# Patient Record
Sex: Female | Born: 1940 | Race: White | Hispanic: No | Marital: Married | Smoking: Never smoker
Health system: Southern US, Community
[De-identification: ages and names within clinical notes are randomized; demographics above are authoritative.]

## PROBLEM LIST (undated history)

## (undated) DIAGNOSIS — I739 Peripheral vascular disease, unspecified: Secondary | ICD-10-CM

## (undated) DIAGNOSIS — M1990 Unspecified osteoarthritis, unspecified site: Secondary | ICD-10-CM

## (undated) DIAGNOSIS — E7800 Pure hypercholesterolemia, unspecified: Secondary | ICD-10-CM

## (undated) DIAGNOSIS — M109 Gout, unspecified: Secondary | ICD-10-CM

## (undated) DIAGNOSIS — G629 Polyneuropathy, unspecified: Secondary | ICD-10-CM

## (undated) DIAGNOSIS — F32A Depression, unspecified: Secondary | ICD-10-CM

## (undated) DIAGNOSIS — T148XXA Other injury of unspecified body region, initial encounter: Secondary | ICD-10-CM

## (undated) DIAGNOSIS — I10 Essential (primary) hypertension: Secondary | ICD-10-CM

## (undated) DIAGNOSIS — D649 Anemia, unspecified: Secondary | ICD-10-CM

## (undated) DIAGNOSIS — E119 Type 2 diabetes mellitus without complications: Secondary | ICD-10-CM

## (undated) DIAGNOSIS — F329 Major depressive disorder, single episode, unspecified: Secondary | ICD-10-CM

## (undated) DIAGNOSIS — F419 Anxiety disorder, unspecified: Secondary | ICD-10-CM

## (undated) HISTORY — PX: COLONOSCOPY: SHX174

## (undated) HISTORY — PX: TOTAL SHOULDER ARTHROPLASTY: SHX126

## (undated) HISTORY — PX: REVERSE SHOULDER ARTHROPLASTY: SHX5054

## (undated) HISTORY — PX: UPPER GI ENDOSCOPY: SHX6162

## (undated) HISTORY — PX: AMPUTATION: SHX166

## (undated) HISTORY — PX: TOTAL KNEE ARTHROPLASTY: SHX125

## (undated) HISTORY — PX: CATARACT EXTRACTION, BILATERAL: SHX1313

## (undated) HISTORY — PX: FOOT SURGERY: SHX648

---

## 1997-11-20 ENCOUNTER — Inpatient Hospital Stay (HOSPITAL_COMMUNITY): Admission: RE | Admit: 1997-11-20 | Discharge: 1997-11-24 | Payer: Self-pay | Admitting: Orthopedic Surgery

## 1998-03-12 ENCOUNTER — Inpatient Hospital Stay (HOSPITAL_COMMUNITY): Admission: RE | Admit: 1998-03-12 | Discharge: 1998-03-16 | Payer: Self-pay | Admitting: Orthopedic Surgery

## 1998-06-14 ENCOUNTER — Other Ambulatory Visit: Admission: RE | Admit: 1998-06-14 | Discharge: 1998-06-14 | Payer: Self-pay | Admitting: Obstetrics and Gynecology

## 1998-07-13 ENCOUNTER — Other Ambulatory Visit: Admission: RE | Admit: 1998-07-13 | Discharge: 1998-07-13 | Payer: Self-pay | Admitting: Obstetrics and Gynecology

## 1999-02-19 ENCOUNTER — Encounter: Admission: RE | Admit: 1999-02-19 | Discharge: 1999-05-20 | Payer: Self-pay | Admitting: Orthopedic Surgery

## 1999-03-19 ENCOUNTER — Encounter: Payer: Self-pay | Admitting: Orthopedic Surgery

## 1999-04-30 ENCOUNTER — Encounter: Payer: Self-pay | Admitting: Orthopedic Surgery

## 1999-05-27 ENCOUNTER — Encounter: Admission: RE | Admit: 1999-05-27 | Discharge: 1999-08-25 | Payer: Self-pay | Admitting: Orthopedic Surgery

## 1999-05-28 ENCOUNTER — Encounter: Payer: Self-pay | Admitting: Orthopedic Surgery

## 1999-09-04 ENCOUNTER — Encounter: Payer: Self-pay | Admitting: Orthopedic Surgery

## 1999-09-04 ENCOUNTER — Encounter: Admission: RE | Admit: 1999-09-04 | Discharge: 1999-09-04 | Payer: Self-pay | Admitting: Orthopedic Surgery

## 1999-09-12 ENCOUNTER — Encounter: Payer: Self-pay | Admitting: Orthopedic Surgery

## 1999-09-12 ENCOUNTER — Encounter: Admission: RE | Admit: 1999-09-12 | Discharge: 1999-09-12 | Payer: Self-pay | Admitting: Orthopedic Surgery

## 1999-09-24 ENCOUNTER — Encounter: Admission: RE | Admit: 1999-09-24 | Discharge: 1999-12-23 | Payer: Self-pay | Admitting: Orthopedic Surgery

## 1999-10-03 ENCOUNTER — Encounter: Admission: RE | Admit: 1999-10-03 | Discharge: 1999-10-03 | Payer: Self-pay | Admitting: Family Medicine

## 1999-12-24 ENCOUNTER — Encounter: Admission: RE | Admit: 1999-12-24 | Discharge: 2000-03-09 | Payer: Self-pay | Admitting: Orthopedic Surgery

## 2000-03-25 ENCOUNTER — Encounter: Payer: Self-pay | Admitting: Orthopedic Surgery

## 2000-04-01 ENCOUNTER — Inpatient Hospital Stay (HOSPITAL_COMMUNITY): Admission: RE | Admit: 2000-04-01 | Discharge: 2000-04-04 | Payer: Self-pay | Admitting: Orthopedic Surgery

## 2000-04-28 ENCOUNTER — Encounter: Admission: RE | Admit: 2000-04-28 | Discharge: 2000-05-04 | Payer: Self-pay | Admitting: Internal Medicine

## 2000-08-19 ENCOUNTER — Encounter: Admission: RE | Admit: 2000-08-19 | Discharge: 2000-08-24 | Payer: Self-pay | Admitting: Internal Medicine

## 2000-10-20 ENCOUNTER — Encounter: Admission: RE | Admit: 2000-10-20 | Discharge: 2000-10-20 | Payer: Self-pay | Admitting: Family Medicine

## 2000-10-20 ENCOUNTER — Encounter: Payer: Self-pay | Admitting: Family Medicine

## 2000-11-11 ENCOUNTER — Encounter: Admission: RE | Admit: 2000-11-11 | Discharge: 2000-11-16 | Payer: Self-pay | Admitting: Internal Medicine

## 2001-02-26 ENCOUNTER — Encounter: Admission: RE | Admit: 2001-02-26 | Discharge: 2001-03-08 | Payer: Self-pay | Admitting: Orthopedic Surgery

## 2001-06-21 ENCOUNTER — Encounter (HOSPITAL_BASED_OUTPATIENT_CLINIC_OR_DEPARTMENT_OTHER): Admission: RE | Admit: 2001-06-21 | Discharge: 2001-06-25 | Payer: Self-pay | Admitting: Orthopedic Surgery

## 2001-09-24 ENCOUNTER — Encounter (HOSPITAL_BASED_OUTPATIENT_CLINIC_OR_DEPARTMENT_OTHER): Admission: RE | Admit: 2001-09-24 | Discharge: 2001-10-01 | Payer: Self-pay | Admitting: Orthopedic Surgery

## 2001-10-25 ENCOUNTER — Encounter: Payer: Self-pay | Admitting: Family Medicine

## 2001-10-25 ENCOUNTER — Encounter: Admission: RE | Admit: 2001-10-25 | Discharge: 2001-10-25 | Payer: Self-pay | Admitting: Family Medicine

## 2002-01-03 ENCOUNTER — Encounter (HOSPITAL_BASED_OUTPATIENT_CLINIC_OR_DEPARTMENT_OTHER): Admission: RE | Admit: 2002-01-03 | Discharge: 2002-04-03 | Payer: Self-pay | Admitting: Internal Medicine

## 2002-04-11 ENCOUNTER — Encounter (HOSPITAL_BASED_OUTPATIENT_CLINIC_OR_DEPARTMENT_OTHER): Admission: RE | Admit: 2002-04-11 | Discharge: 2002-07-10 | Payer: Self-pay | Admitting: Internal Medicine

## 2002-07-14 ENCOUNTER — Encounter (HOSPITAL_BASED_OUTPATIENT_CLINIC_OR_DEPARTMENT_OTHER): Admission: RE | Admit: 2002-07-14 | Discharge: 2002-10-12 | Payer: Self-pay | Admitting: Internal Medicine

## 2002-11-17 ENCOUNTER — Encounter: Admission: RE | Admit: 2002-11-17 | Discharge: 2002-11-17 | Payer: Self-pay

## 2003-11-20 ENCOUNTER — Encounter: Admission: RE | Admit: 2003-11-20 | Discharge: 2003-11-20 | Payer: Self-pay

## 2004-12-02 ENCOUNTER — Encounter: Admission: RE | Admit: 2004-12-02 | Discharge: 2004-12-02 | Payer: Self-pay

## 2005-12-04 ENCOUNTER — Encounter: Admission: RE | Admit: 2005-12-04 | Discharge: 2005-12-04 | Payer: Self-pay

## 2006-12-07 ENCOUNTER — Encounter: Admission: RE | Admit: 2006-12-07 | Discharge: 2006-12-07 | Payer: Self-pay

## 2007-12-08 ENCOUNTER — Encounter: Admission: RE | Admit: 2007-12-08 | Discharge: 2007-12-08 | Payer: Self-pay

## 2008-12-11 ENCOUNTER — Encounter: Admission: RE | Admit: 2008-12-11 | Discharge: 2008-12-11 | Payer: Self-pay

## 2009-12-12 ENCOUNTER — Encounter: Admission: RE | Admit: 2009-12-12 | Discharge: 2009-12-12 | Payer: Self-pay

## 2010-06-28 NOTE — H&P (Signed)
[facility]  Patient:    Caroline Day, Caroline Day                  MRN: [id number] Adm. Date:  04/01/00 Attending:  Carlisle Beers. Dorothyann Gibbs, M.D. Dictator:   Jamelle Rushing, P.A.-C.                         History and Physical  DATE OF BIRTH:  1940-06-26  CHIEF COMPLAINT:  Right knee pain.  HISTORY OF PRESENT ILLNESS:  The patient is a 70 year old obese white female with a history of right total knee arthroplasty performed in October 1999, and a left nee arthroplasty performed in January 2000.  The patient was placed in a short-leg ast for Charcots foot for about one year, and about the time she was to come out of  the cast around June 2001, the patient started to develop right knee pain once again.  The patient would have this severe pain in her knee with ambulation and  range of motion of the knee.  The patient would describe it as a deep ache, tooth ache in quality with weightbearing activities.  It has progressively worsened with time.  She occasionally feels like there is actually something physically loose  within her knee.  X-rays in the office revealed that she does have loosening of her tibial tray.  The patient has been ambulating with the use of a cane, and at times when it is most severe, she does use a wheelchair around the house.  ALLERGIES:  No known drug allergies.  CURRENT MEDICATIONS: 1. Lotrel 5/20 mg p.o. q.d. 2. Hydrochlorothiazide 25 mg p.o. q.d. 3. Lipitor 10 mg p.o. q.d. 4. Imipramine HCl 25 mg p.o. q.d. 5. Evista 60 mg p.o. q.d. 6. Glucophage 500 mg p.o. b.i.d.  PAST MEDICAL HISTORY: 1. The patient has been diagnosed with diabetes mellitus approximately    three months ago.  It was an incidental finding on a physical examination.    She has normally had her blood sugar since being on this medication in    the 140-170 range. 2. The patient has been on her hypertension medication for many years now.    It has been  fairly stable, with no medication adjustments.  The patient denies any specific thyroid disease, hiatal hernia, peptic ulcers, heart disease, or asthma.  PAST SURGICAL HISTORY: 1. A right total knee arthroplasty in October 1999. 2. Left total knee arthroplasty in January 2000. 3. Left shoulder open rotator cuff debridement in October 2001.  The patient denies any complications with the above-mentioned surgical procedures.  SOCIAL HISTORY:  The patient is a 70 year old obese white female, who denies any history of smoking or alcohol use.  The patient is married, with one child. Lives in a one-story house with two steps to the main entrance.  FAMILY PHYSICIAN:  Listed as Dr. Konrad Felix at [phone].  FAMILY HISTORY:  Mother deceased at age 53 from cardiac disease.  Father deceased at age 25 from Alzheimers.  The patient has two brothers alive and in good health.  One sister alive with a cardiac pacemaker and thyroid disease.  REVIEW OF SYSTEMS:  Positive for lower dentures.  The patient does use glasses t all times.  She has been having problems with urinary incontinence and increased frequency, and for this she has recently been placed on the imipramine, which has significantly improved her incontinence.  Otherwise the review of systems  is negative and noncontributory for any other GENERAL, SENSORY, RESPIRATORY, CARDIAC, GI, GU, HEMATOLOGIC, MUSCULOSKELETAL, NEUROLOGIC, MENTAL STATUS problems that are contributory at this time.  PHYSICAL EXAMINATION:  VITAL SIGNS:  Height 5 feet 9 inches, weight 300 pounds, pulse 92, respirations 12, temperature 97.0 degrees, blood pressure 148/92.  GENERAL:  This is a well-developed, significantly obese white female.  She does  walk with a cane in her right hand, and she does walk with a significant limp. She is able to get on and off the examination table without any assistance.  No  significant obvious distress.  HEENT:  Head  normocephalic, atraumatic.  Nontender over the maxillary or frontal sinuses.  Pupils equal, round, reactive to light and accommodation. Extraocular movements intact.  Sclerae anicteric.  Conjunctivae pink and moist.  External ears without deformities.  Canals patent.  Tympanic membranes pearly gray and intact. Gross hearing is intact.  Oral buccal mucosa was pink and moist, without lesions. Lower dentures were in place.  Upper dentition in fair repair.  Uvula midline, moved symmetrically with phonation.  The patient was able to swallow without any difficulty.  Nasal septum was midline.  Mucous membranes pink and moist.  No polyps noted.  NECK:  Supple, no palpable lymphadenopathy.  Thyroid gland nontender.  the patient had good range of motion of her cervical spine without any difficulty or tenderness.  CHEST:  Lung sounds clear and equal bilaterally.  No wheezes, rales, or rhonchi, or rubs noted.  HEART:  A regular rate and rhythm with S1 and S2 auscultated.  No murmurs, rubs, or gallops noted.  ABDOMEN:  Excessively round and obese.  Unable to palpate any hepatosplenomegaly due to her obesity.  She was nontender to deep palpation.  Bowel sounds present and normoactive throughout.   CVA was nontender to percussion.  EXTREMITIES:  Upper extremities:  Left shoulder:  The patient was able to only abduct to about 90 degrees both laterally, and extend anteriorly to 90 degrees.  Right shoulder:  She had an excellent range of motion without any difficulty or  tenderness.  Right and left elbows and wrists had excellent range of motion without any difficulty or tenderness.  She had 5/5 motor strength in all muscle groups.   Lower extremities:  Right and left hip had an excellent range of motion with 20-30 degrees of internal and external rotation, and 0-120 degrees of flexion.  Right and left knees were symmetric in size and shape.  She had well-healed  midline surgical incisions over both right and left knees.  Right and left knee medial and lateral joint line were nontender to palpation.  She had no palpable effusion in either   knee.  She had full extension and flexion to about 90 degrees in the right and eft knee.  The right knee did have about 10 degrees of valgus and varus laxity, but no tenderness.  Left knee had very trace valgus and varus laxity.  She had no calf  tenderness.  Bilateral ankles were symmetrical with a good range of motion.  PERIPHERAL VASCULAR:  Carotid pulses 1+, no bruits.  Radial pulses were 2+. Unable to palpate any femoral pulses.  Dorsalis pedis pulses were 1+.  The patient had  trace amount of lower extremity edema.  She had no venous stasis changes, and no obvious varicosities.  NEUROLOGIC:  The patient was conscious, alert, and appropriate, and held an easy conversation with the examiner.  Cranial nerves II-XII were grossly intact. Deep tendon reflexes  of the upper and lower extremities were symmetrical.  The patient was grossly intact to light touch sensation from head down to just about the ankles.  She had no light touch sensation about either right or left foot due to her diabetic neuropathy.  BREASTS/RECTAL/GU:  Examinations were deferred.  IMPRESSION: 1. Failed right total knee arthroplasty. 2. Diabetes mellitus type 2. 3. Hypertension. 4. Urinary incontinence. 5. Peripheral diabetic neuropathy.  PLAN:  The patient will be admitted to [facility] on April 01, 2000, under the care of Dr. Jonny Ruiz L. Rendall III.  The patient will undergo ll routine labs and tests for a planned revision of her right total knee arthroplasty. DD:  03/25/00 TD:  03/25/00 Job: [id number]

## 2010-06-28 NOTE — Op Note (Signed)
[facility]  Patient:    Caroline Day, Caroline Day                  MRN: [id number] Proc. Date: 04/01/00 Adm. Date:  [id number] Attending:  Carolan Shiver Ii                           Operative Report  PREOPERATIVE DIAGNOSIS:  Loose right total knee.  SURGICAL PROCEDURE:  Revision of tibial component, right total knee.  POSTOPERATIVE DIAGNOSIS:  Failure of tibial component, right total knee.  SURGEON:  John L. Dorothyann Gibbs, M.D.  ASSISTANT:  Lucienne Minks Duffy, P.A.  PATHOLOGY:  The patient has documented peripheral neuropathy, with a Charcots foot.  A total knee was performed a little over two years ago, and she developed Charcot-like collapse of the total knee and to valgus about six to eight months ago.  She has had full neurologic workup.  She understands that this total knee could also fail.  DESCRIPTION OF PROCEDURE:  Under general anesthesia, the right leg is prepared with Duraprep and draped as a sterile field.  A proximal thigh sterile tourniquet is used.  The leg is wrapped out with the Esmarch and the tourniquet is used to 350 mmHg.  The previous skin incision is excised.  The retinaculum and capsule are opened, and the line of the old Tycron sutures. The patella is everted.  The synovium is boggy and thick, but the joint fluid does not appear infected.  CNS is sent.  Following this, the tibial spacer polyethylene is removed.  The tibial tray is grossly loose and is levered out of the joint and removed.  Dissection around the medial tibia plateau to the posterior surface is done for exposure.  A McHale is placed posteriorly and a Homan laterally.  The proximal tibial cement plug is removed and the canal is cleaned out with curet and Leksell rongeurs.  Following this, an intermedullary guide is used and proximal tibia is recut horizontally until such point as Gore-Tex could be obtained anteriorly, posteriorly on most of both sides.  There was a  slight defect in coverage medially.  Once this was completed the canal was reamed by hand reaming up to 16 mm, and a trial stem with a 16 mm was inserted and 15 mm bearing.  The fit and alignment appeared excellent.  At this point, prior to doing anything else, the femoral component was carefully examined.  It was solidly ingrown to the femur.  The patellar component was solidly ingrown to the patella.  The patella plastic was applied.  At this point the permanent tibial revision component was placed, with a long fluted stem, and 95 mm fluted stem was inserted, and the custom LCS tibial tray (15 mm thick, standard-plus size).  These were cemented in place; and in the process of driving them into the knee, the large mallet head broke off the handle.  This occurred 5 mm higher than where I would like to have seated the tibial component; but it took 5 min to get another hammer into the room, and the cement had totally hardened at that point.  An attempt to drive the prosthesis deeper with the small hammer was completely unsuccessful. However, the overall position and alignment, and the 5 mm cement mantle were felt to be acceptable; and better to leave it like this than tear everything out and reapply.  So, at this point the tourniquet was let  down.  Multiple small vessels were cauterized.  The knee was closed in layers with #1 Tycron, 0 and 2-0 Vicryl, and skin clips.  OPERATIVE TIME:  Approximately 1 hour 20 min.  DISPOSITION:  The patient tolerated the procedure well and returned to recovery in good condition.  Excellent fit and alignment of the prosthesis in the knee were felt to have been obtained, as the knee was quite stable through a normal range of motion. DD:  04/01/00 TD:  04/02/00 Job: [id number]

## 2010-06-28 NOTE — Discharge Summary (Signed)
[facility]  Patient:    Caroline Day, Caroline Day                  MRN: [id number] Adm. Date:  [id number] Disc. Date: [id number] Attending:  Sharren Bridge Dictator:   Jamelle Rushing, P.A.                           Discharge Summary  ADMISSION DIAGNOSES: 1. Failed right total knee arthroplasty. 2. Diabetes mellitus, type 2. 3. Hypertension. 4. Urinary incontinence. 5. Peripheral diabetic neuropathy.  DISCHARGE DIAGNOSES: 1. Revision of right total knee arthroplasty. 2. Diabetes mellitus, type 2. 3. Hypertension. 4. Urinary incontinence. 5. Peripheral diabetic neuropathy. 6. Obesity.  HISTORY OF PRESENT ILLNESS:  The patient is a 70 year old, obese, white female with a history of right total knee arthroplasty performed in October of 1999. The patient had a left total knee arthroplasty performed in January of 2000. The patient was placed in a short leg cast for a Charcot foot for about one year.  At about that time, she was starting to come out of the cast.  Around June of 2001, the patient started developing right knee pain.  The patient would have this severe pain in her knee with ambulation and any range of motion.  The patient described the pain as a deep ache of toothache quality with weightbearing activities.  It progressively worsened with time.  She occasionally feels like there is actually something physically loose within the knee.  X-rays in the office revealed that she does have a loosening of her tibial tray.  ALLERGIES:  No known drug allergies.  CURRENT MEDICATIONS: 1. Lotrel 5/220 mg p.o. q.d. 2. Hydrochlorothiazide 25 mg p.o. q.d. 3. Lipitor 10 mg p.o. q.d. 4. Imipramine HCl 25 mg p.o. q.d. 5. Evista 60 mg p.o. q.d. 6. Glucophage 70 mg p.o. b.i.d.  SURGICAL PROCEDURE:  On April 01, 2000, the patient was taken to the OR by Jonny Ruiz L. Dorothyann Gibbs, M.D., assisted by Arnoldo Morale, P.A.  A revision of her right total knee with  tibial component was performed.  The patient tolerated the procedure well.  The operative time was approximately 1 hour and 20 minutes.  The patient was transferred to the recovery room in good condition.  CONSULTS:  On April 01, 2000, the patient had the following routine consults:  Physical therapy, occupational therapy, rehabilitation, care management, and pharmacy for routine dosing of Coumadin for DVT prophylaxis.  HOSPITAL COURSE:  On April 01, 2000, the patient was admitted to [facility] under the care of John L. Dorothyann Gibbs, M.D.  The patient underwent a revision of her right total knee arthroplasty.  The patient tolerated the procedure well and was transferred to the recovery room in good condition.  The patient then incurred a three-day postoperative course in which she did have some problems with muscle spasms her first night and some slight nausea the second night, but then progressed very well with physical therapy.  Her labs remained stable.  Her vital signs remained stable and her right knee remained negative for any Homans sign.  It remained neuromotor and vascularly intact.  The patient was transferred from the PCA to oral pain medications on postoperative day #2 without any complaints.  The patient was discharged to home on postoperative day #3 with home health physical therapy and R.N. arrangements made.  LABORATORY DATA:  On April 04, 2000, the WBC was 8.1, hemoglobin 12.3,  hematocrit 36.2, and platelets 209.  Coagulation studies on April 04, 2000, were PT 20.4 with an INR of 2.3.  Routine chemistries on April 03, 2000, were sodium of 134, potassium 3.2, glucose 231, BUN 7, and creatinine 0.7. The patients glucose had remained elevated and she was having her medications and diet adjusted per in-house diabetic management committee.  Routine urinalysis on admission was cloudy and had large hemoglobin, but was otherwise normal.  The EKG on  admission was normal sinus rhythm with occasional premature supraventricular complexes at 87 beats per minute.  The chest x-ray was no active disease on admission.  MEDICATIONS ON DISCHARGE:  1. Tylenol 650 mg p.o. q.6h. p.r.n.  2. Norvasc 5 mg p.o. q.d.  3. Lotensin 20 mg p.o. q.d.  4. Dulcolax 15 mg p.o. q.d.  5. Colace 100 mg p.o. b.i.d.  6. Hydrochlorothiazide 25 mg p.o. q.d.  7. Imipramine HCl 25 mg p.o. q.d.  8. Skelaxin 400 mg two tablets p.o. q.6h. p.r.n. spasms.  9. Glucophage 500 mg p.o. b.i.d. 10. Oxycodone 20 mg p.o. q.12h. 11. Percocet one or two tablets every four to six hours p.r.n. breakthrough     pain. 12. Zocor 20 mg p.o. q.d. 13. Coumadin 7.5 mg p.o. q.d.  DISCHARGE INSTRUCTIONS: 1. Medications:  Coumadin per pharmacy dosing, Percocet one or two tablets    every four to six hours p.r.n. pain. 2. Activity:  Knee precautions. 3. Diet:  An ADA diet. 4. Wound care:  Keep the dressing clean and dry. 5. Special instructions:  [facility] to provide prothrombin time and    home health physical therapy.  CONDITION ON DISCHARGE:  The patients condition on discharge to home is listed as good. DD:  05/11/00 TD:  05/11/00 Job: [id number]

## 2010-12-04 ENCOUNTER — Other Ambulatory Visit: Payer: Self-pay | Admitting: Family Medicine

## 2010-12-04 DIAGNOSIS — Z1231 Encounter for screening mammogram for malignant neoplasm of breast: Secondary | ICD-10-CM

## 2010-12-18 ENCOUNTER — Ambulatory Visit
Admission: RE | Admit: 2010-12-18 | Discharge: 2010-12-18 | Disposition: A | Discharge status: Home / Self Care | Payer: Medicare Other | Source: Ambulatory Visit | Attending: Family Medicine | Admitting: Family Medicine

## 2010-12-18 DIAGNOSIS — Z1231 Encounter for screening mammogram for malignant neoplasm of breast: Secondary | ICD-10-CM

## 2011-11-21 ENCOUNTER — Other Ambulatory Visit: Payer: Self-pay | Admitting: Family Medicine

## 2011-11-21 DIAGNOSIS — Z1231 Encounter for screening mammogram for malignant neoplasm of breast: Secondary | ICD-10-CM

## 2011-12-24 ENCOUNTER — Ambulatory Visit
Admission: RE | Admit: 2011-12-24 | Discharge: 2011-12-24 | Disposition: A | Discharge status: Home / Self Care | Payer: BC Managed Care – PPO | Source: Ambulatory Visit | Attending: Family Medicine | Admitting: Family Medicine

## 2011-12-24 DIAGNOSIS — Z1231 Encounter for screening mammogram for malignant neoplasm of breast: Secondary | ICD-10-CM

## 2012-11-18 ENCOUNTER — Other Ambulatory Visit: Payer: Self-pay

## 2012-11-18 DIAGNOSIS — Z1231 Encounter for screening mammogram for malignant neoplasm of breast: Secondary | ICD-10-CM

## 2012-12-31 ENCOUNTER — Ambulatory Visit
Admission: RE | Admit: 2012-12-31 | Discharge: 2012-12-31 | Disposition: A | Discharge status: Home / Self Care | Payer: Medicare Other | Source: Ambulatory Visit

## 2012-12-31 DIAGNOSIS — Z1231 Encounter for screening mammogram for malignant neoplasm of breast: Secondary | ICD-10-CM

## 2016-03-10 NOTE — Patient Instructions (Signed)
Your procedure is scheduled on:  Tomorrow, Jan. 31, 2018  Enter through the [employer] of [facility] at:  11:30 AM  Pick up the phone at the desk and dial [phone].  Call this number if you have problems the morning of surgery: [phone].  Remember: Do NOT eat food:  After Midnight Tonight  Do NOT drink clear liquids after:  9:00 AM day of surgery  Take these medicines the morning of surgery with a SIP OF WATER:  Allopurinol, Atorvastatin, Furosemide, Gabapentin, Lisinopril, Metoprolol  Stop ALL herbal medications at this time  Do NOT smoke the day of surgery.  Do NOT wear jewelry (body piercing), metal hair clips/bobby pins, make-up, or nail polish. Do NOT wear lotions, powders, or perfumes.  You may wear deodorant. Do NOT shave for 48 hours prior to surgery. Do NOT bring valuables to the hospital. Contacts, dentures, or bridgework may not be worn into surgery.  Have a responsible adult drive you home and stay with you for 24 hours after your procedure  Bring a copy of your healthcare power of attorney and living will documents.  **Effective Friday, Jan. 12, 2018, [place] will implement no hospital visitations from children age 84 and younger due to a steady increase in flu activity in our community and hospitals. **

## 2016-03-10 NOTE — H&P (Signed)
Caroline Day is an 76 y.o. female with postmenopausal bleeding and thickened endometrium and possible intrauterine polyp or fibroid.  Recent endometrial biopsy negative.  Pertinent Gynecological History: Menses: post-menopausal Bleeding: post menopausal bleeding Contraception: none DES exposure: denies Blood transfusions: none Sexually transmitted diseases: no past history Previous GYN Procedures: na  Last mammogram: normal Date: 2017   Last pap: unknown Date: na  OB History: G0, P0   Menstrual History: Menarche age: uunknown No LMP recorded. Patient is postmenopausal.  past medical history indicates diabetes, hypertension and arthritis Past surgery: shoulder replacement, knee replacements, cholecystectomy, foot surgery toe amputation No family history on file.  Social History:  has no tobacco, alcohol, and drug history on file.  Allergies:  Allergies  Allergen Reactions  . Vancomycin Itching    No prescriptions prior to admission.    ROS  There were no vitals taken for this visit. Physical Exam  Constitutional: She is oriented to person, place, and time. She appears well-developed and well-nourished.  HENT:  Head: Normocephalic.  Eyes: Conjunctivae and EOM are normal. Pupils are equal, round, and reactive to light.  Cardiovascular: Normal rate, regular rhythm and normal heart sounds.   Respiratory: Effort normal and breath sounds normal.  GI: Soft. Bowel sounds are normal.  Genitourinary: Vagina normal and uterus normal.  Musculoskeletal: Normal range of motion.  Neurological: She is alert and oriented to person, place, and time.    No results found for this or any previous visit (from the past 24 hour(s)).  No results found.  Assessment/Plan:Hysteroscopy with myosure and uterine curetting.  Risk discussed Infection. hemorrhage with possible transfusion with risk of AIDS or hepatitis.  Excessive bleeding could require hysterectomy. Risk of perforation with  injuring to adjacent organs requiring exp surgery. Risk of DVT and PE>  Maor Meckel S 03/10/2016, 8:21 AM

## 2016-03-11 ENCOUNTER — Encounter (HOSPITAL_COMMUNITY): Payer: Self-pay

## 2016-03-11 ENCOUNTER — Encounter (HOSPITAL_COMMUNITY)
Admission: RE | Admit: 2016-03-11 | Discharge: 2016-03-11 | Disposition: A | Discharge status: Home / Self Care | Payer: Medicare Other | Source: Ambulatory Visit | Attending: Obstetrics and Gynecology | Admitting: Obstetrics and Gynecology

## 2016-03-11 DIAGNOSIS — M1990 Unspecified osteoarthritis, unspecified site: Secondary | ICD-10-CM | POA: Diagnosis not present

## 2016-03-11 DIAGNOSIS — D259 Leiomyoma of uterus, unspecified: Secondary | ICD-10-CM | POA: Diagnosis not present

## 2016-03-11 DIAGNOSIS — Z01818 Encounter for other preprocedural examination: Secondary | ICD-10-CM | POA: Insufficient documentation

## 2016-03-11 DIAGNOSIS — N840 Polyp of corpus uteri: Secondary | ICD-10-CM | POA: Diagnosis not present

## 2016-03-11 DIAGNOSIS — Z881 Allergy status to other antibiotic agents status: Secondary | ICD-10-CM | POA: Diagnosis not present

## 2016-03-11 DIAGNOSIS — I10 Essential (primary) hypertension: Secondary | ICD-10-CM | POA: Diagnosis not present

## 2016-03-11 DIAGNOSIS — E119 Type 2 diabetes mellitus without complications: Secondary | ICD-10-CM | POA: Diagnosis not present

## 2016-03-11 DIAGNOSIS — F329 Major depressive disorder, single episode, unspecified: Secondary | ICD-10-CM | POA: Diagnosis not present

## 2016-03-11 DIAGNOSIS — F419 Anxiety disorder, unspecified: Secondary | ICD-10-CM | POA: Diagnosis not present

## 2016-03-11 DIAGNOSIS — Z9049 Acquired absence of other specified parts of digestive tract: Secondary | ICD-10-CM | POA: Diagnosis not present

## 2016-03-11 DIAGNOSIS — N950 Postmenopausal bleeding: Secondary | ICD-10-CM | POA: Diagnosis present

## 2016-03-11 HISTORY — DX: Major depressive disorder, single episode, unspecified: F32.9

## 2016-03-11 HISTORY — DX: Peripheral vascular disease, unspecified: I73.9

## 2016-03-11 HISTORY — DX: Depression, unspecified: F32.A

## 2016-03-11 HISTORY — DX: Pure hypercholesterolemia, unspecified: E78.00

## 2016-03-11 HISTORY — DX: Polyneuropathy, unspecified: G62.9

## 2016-03-11 HISTORY — DX: Essential (primary) hypertension: I10

## 2016-03-11 HISTORY — DX: Type 2 diabetes mellitus without complications: E11.9

## 2016-03-11 HISTORY — DX: Anxiety disorder, unspecified: F41.9

## 2016-03-11 HISTORY — DX: Gout, unspecified: M10.9

## 2016-03-11 HISTORY — DX: Other injury of unspecified body region, initial encounter: T14.8XXA

## 2016-03-11 HISTORY — DX: Unspecified osteoarthritis, unspecified site: M19.90

## 2016-03-11 HISTORY — DX: Anemia, unspecified: D64.9

## 2016-03-11 LAB — CBC
HCT: 34.5 % — ABNORMAL LOW (ref 36.0–46.0)
HEMOGLOBIN: 11 g/dL — AB (ref 12.0–15.0)
MCH: 29.8 pg (ref 26.0–34.0)
MCHC: 31.9 g/dL (ref 30.0–36.0)
MCV: 93.5 fL (ref 78.0–100.0)
PLATELETS: 272 10*3/uL (ref 150–400)
RBC: 3.69 MIL/uL — ABNORMAL LOW (ref 3.87–5.11)
RDW: 15.5 % (ref 11.5–15.5)
WBC: 8 10*3/uL (ref 4.0–10.5)

## 2016-03-11 LAB — BASIC METABOLIC PANEL
Anion gap: 5 (ref 5–15)
BUN: 36 mg/dL — AB (ref 6–20)
CALCIUM: 9.3 mg/dL (ref 8.9–10.3)
CO2: 30 mmol/L (ref 22–32)
CREATININE: 1.52 mg/dL — AB (ref 0.44–1.00)
Chloride: 102 mmol/L (ref 101–111)
GFR calc Af Amer: 38 mL/min — ABNORMAL LOW (ref >60)
GFR calc non Af Amer: 32 mL/min — ABNORMAL LOW (ref >60)
GLUCOSE: 54 mg/dL — AB (ref 65–99)
Potassium: 5 mmol/L (ref 3.5–5.1)
Sodium: 137 mmol/L (ref 135–145)

## 2016-03-11 LAB — TYPE AND SCREEN
ABO/RH(D): B POS
Antibody Screen: NEGATIVE

## 2016-03-11 LAB — ABO/RH: ABO/RH(D): B POS

## 2016-03-11 NOTE — Patient Instructions (Addendum)
Your procedure is scheduled on:  Tomorrow, Jan. 31, 2018  Enter through the [employer] of [facility] at:  11:30 AM  Pick up the phone at the desk and dial [phone].  Call this number if you have problems the morning of surgery: [phone].  Remember: Do NOT eat food:  After Midnight Tonight  Do NOT drink clear liquids after:  9:00 AM day of surgery  Take these medicines the morning of surgery with a SIP OF WATER:  Atorvastatin, Furosemide, Gabapentin, Lisinopril, Metoprolol  Take 1/2 (half) of bedtime insulin dose tonight  Stop ALL herbal medications at this time  Do NOT smoke the day of surgery.  Do NOT wear jewelry (body piercing), metal hair clips/bobby pins, make-up, or nail polish. Do NOT wear lotions, powders, or perfumes.  You may wear deodorant. Do NOT shave for 48 hours prior to surgery. Do NOT bring valuables to the hospital. Contacts, dentures, or bridgework may not be worn into surgery.  Have a responsible adult drive you home and stay with you for 24 hours after your procedure  Bring a copy of your healthcare power of attorney and living will documents.  **Effective Friday, Jan. 12, 2018, [place] [facility] will implement no hospital visitations from children age 63 and younger due to a steady increase in flu activity in our community and hospitals. **

## 2016-03-11 NOTE — Pre-Procedure Instructions (Signed)
Dr. Royce Macadamia made aware of BMP lab values, he instructed me to inform Dr. Radene Knee.  I spoke with Nira Conn at Dr. Sherran Needs office she stated she will inform him of the results.  No other orders received at this time.

## 2016-03-12 ENCOUNTER — Encounter (HOSPITAL_COMMUNITY)
Admission: RE | Disposition: A | Discharge status: Home / Self Care | Payer: Self-pay | Source: Ambulatory Visit | Attending: Obstetrics and Gynecology

## 2016-03-12 ENCOUNTER — Ambulatory Visit (HOSPITAL_COMMUNITY)
Admission: RE | Admit: 2016-03-12 | Discharge: 2016-03-12 | Disposition: A | Discharge status: Home / Self Care | Payer: Medicare Other | Source: Ambulatory Visit | Attending: Obstetrics and Gynecology | Admitting: Obstetrics and Gynecology

## 2016-03-12 ENCOUNTER — Ambulatory Visit (HOSPITAL_COMMUNITY): Payer: Medicare Other | Admitting: Anesthesiology

## 2016-03-12 ENCOUNTER — Encounter (HOSPITAL_COMMUNITY): Payer: Self-pay | Admitting: Anesthesiology

## 2016-03-12 DIAGNOSIS — D259 Leiomyoma of uterus, unspecified: Secondary | ICD-10-CM | POA: Diagnosis not present

## 2016-03-12 DIAGNOSIS — N840 Polyp of corpus uteri: Secondary | ICD-10-CM

## 2016-03-12 DIAGNOSIS — E119 Type 2 diabetes mellitus without complications: Secondary | ICD-10-CM | POA: Diagnosis not present

## 2016-03-12 DIAGNOSIS — Z881 Allergy status to other antibiotic agents status: Secondary | ICD-10-CM | POA: Insufficient documentation

## 2016-03-12 DIAGNOSIS — I10 Essential (primary) hypertension: Secondary | ICD-10-CM | POA: Insufficient documentation

## 2016-03-12 DIAGNOSIS — F419 Anxiety disorder, unspecified: Secondary | ICD-10-CM | POA: Insufficient documentation

## 2016-03-12 DIAGNOSIS — Z9049 Acquired absence of other specified parts of digestive tract: Secondary | ICD-10-CM | POA: Insufficient documentation

## 2016-03-12 DIAGNOSIS — F329 Major depressive disorder, single episode, unspecified: Secondary | ICD-10-CM | POA: Insufficient documentation

## 2016-03-12 DIAGNOSIS — N950 Postmenopausal bleeding: Secondary | ICD-10-CM

## 2016-03-12 DIAGNOSIS — D250 Submucous leiomyoma of uterus: Secondary | ICD-10-CM

## 2016-03-12 DIAGNOSIS — M1990 Unspecified osteoarthritis, unspecified site: Secondary | ICD-10-CM | POA: Insufficient documentation

## 2016-03-12 HISTORY — PX: DILATATION & CURETTAGE/HYSTEROSCOPY WITH MYOSURE: SHX6511

## 2016-03-12 LAB — GLUCOSE, CAPILLARY
GLUCOSE-CAPILLARY: 58 mg/dL — AB (ref 65–99)
GLUCOSE-CAPILLARY: 81 mg/dL (ref 65–99)
Glucose-Capillary: 83 mg/dL (ref 65–99)
Glucose-Capillary: 23 mg/dL — CL (ref 65–99)
Glucose-Capillary: 55 mg/dL — ABNORMAL LOW (ref 65–99)
Glucose-Capillary: 86 mg/dL (ref 65–99)

## 2016-03-12 SURGERY — DILATATION & CURETTAGE/HYSTEROSCOPY WITH MYOSURE
Anesthesia: General | Site: Vagina

## 2016-03-12 MED ORDER — DEXTROSE 50 % IV SOLN
25.0000 mL | Freq: Once | INTRAVENOUS | Status: AC
  Administered 2016-03-12: 25 mL via INTRAVENOUS

## 2016-03-12 MED ORDER — PHENYLEPHRINE HCL 10 MG/ML IJ SOLN
INTRAMUSCULAR | Status: DC | PRN
  Administered 2016-03-12: 120 mg via INTRAVENOUS
  Administered 2016-03-12: 160 mg via INTRAVENOUS
  Administered 2016-03-12: 20 mg via INTRAVENOUS
  Administered 2016-03-12: 40 mg via INTRAVENOUS
  Administered 2016-03-12: 80 mg via INTRAVENOUS
  Administered 2016-03-12: 160 mg via INTRAVENOUS
  Administered 2016-03-12: 120 mg via INTRAVENOUS

## 2016-03-12 MED ORDER — LACTATED RINGERS IV SOLN
Freq: Once | INTRAVENOUS | Status: AC
  Administered 2016-03-12: 500 mL via INTRAVENOUS

## 2016-03-12 MED ORDER — GLYCOPYRROLATE 0.2 MG/ML IJ SOLN
INTRAMUSCULAR | Status: AC
  Filled 2016-03-12: qty 1

## 2016-03-12 MED ORDER — DIPHENHYDRAMINE HCL 50 MG/ML IJ SOLN
INTRAMUSCULAR | Status: AC
  Filled 2016-03-12: qty 1

## 2016-03-12 MED ORDER — DIPHENHYDRAMINE HCL 50 MG/ML IJ SOLN
INTRAMUSCULAR | Status: DC | PRN
  Administered 2016-03-12: 12.5 mg via INTRAVENOUS

## 2016-03-12 MED ORDER — SODIUM CHLORIDE 0.9 % IR SOLN
Status: DC | PRN
  Administered 2016-03-12 (×2): 3000 mL

## 2016-03-12 MED ORDER — LIDOCAINE HCL 1 % IJ SOLN
INTRAMUSCULAR | Status: AC
  Filled 2016-03-12: qty 20

## 2016-03-12 MED ORDER — DEXTROSE IN LACTATED RINGERS 5 % IV SOLN: INTRAVENOUS | Status: DC

## 2016-03-12 MED ORDER — EPHEDRINE 5 MG/ML INJ
INTRAVENOUS | Status: AC
  Filled 2016-03-12: qty 10

## 2016-03-12 MED ORDER — PROPOFOL 10 MG/ML IV BOLUS
INTRAVENOUS | Status: AC
  Filled 2016-03-12: qty 40

## 2016-03-12 MED ORDER — LIDOCAINE HCL (CARDIAC) 20 MG/ML IV SOLN
INTRAVENOUS | Status: DC | PRN
  Administered 2016-03-12: 70 mg via INTRAVENOUS

## 2016-03-12 MED ORDER — LACTATED RINGERS IV SOLN
INTRAVENOUS | Status: DC
  Administered 2016-03-12 (×2): 125 mL/h via INTRAVENOUS

## 2016-03-12 MED ORDER — LIDOCAINE HCL 1 % IJ SOLN
INTRAMUSCULAR | Status: DC | PRN
Start: 2016-03-12 — End: 2016-03-12
  Administered 2016-03-12: 20 mL

## 2016-03-12 MED ORDER — DEXTROSE 50 % IV SOLN
INTRAVENOUS | Status: AC
  Administered 2016-03-12: 25 mL via INTRAVENOUS
  Filled 2016-03-12: qty 50

## 2016-03-12 MED ORDER — ONDANSETRON HCL 4 MG/2ML IJ SOLN
INTRAMUSCULAR | Status: DC | PRN
  Administered 2016-03-12: 4 mg via INTRAVENOUS

## 2016-03-12 MED ORDER — PHENYLEPHRINE 40 MCG/ML (10ML) SYRINGE FOR IV PUSH (FOR BLOOD PRESSURE SUPPORT)
PREFILLED_SYRINGE | INTRAVENOUS | Status: AC
  Filled 2016-03-12: qty 10

## 2016-03-12 MED ORDER — FENTANYL CITRATE (PF) 100 MCG/2ML IJ SOLN
INTRAMUSCULAR | Status: DC | PRN
  Administered 2016-03-12 (×2): 50 ug via INTRAVENOUS

## 2016-03-12 MED ORDER — PROPOFOL 10 MG/ML IV BOLUS
INTRAVENOUS | Status: DC | PRN
  Administered 2016-03-12: 200 mg via INTRAVENOUS

## 2016-03-12 MED ORDER — OXYCODONE-ACETAMINOPHEN 7.5-325 MG PO TABS: 1.0000 | ORAL_TABLET | Freq: Three times a day (TID) | ORAL | 0 refills | Status: DC | PRN

## 2016-03-12 MED ORDER — GLYCOPYRROLATE 0.2 MG/ML IJ SOLN
INTRAMUSCULAR | Status: DC | PRN
  Administered 2016-03-12: 0.2 mg via INTRAVENOUS

## 2016-03-12 MED ORDER — LIDOCAINE HCL (CARDIAC) 20 MG/ML IV SOLN
INTRAVENOUS | Status: AC
  Filled 2016-03-12: qty 5

## 2016-03-12 MED ORDER — CEFAZOLIN SODIUM-DEXTROSE 2-4 GM/100ML-% IV SOLN
2.0000 g | INTRAVENOUS | Status: AC
  Administered 2016-03-12: 3 g via INTRAVENOUS

## 2016-03-12 MED ORDER — DEXTROSE 50 % IV SOLN
INTRAVENOUS | Status: AC
  Filled 2016-03-12: qty 50

## 2016-03-12 MED ORDER — FENTANYL CITRATE (PF) 100 MCG/2ML IJ SOLN
INTRAMUSCULAR | Status: AC
  Filled 2016-03-12: qty 2

## 2016-03-12 MED ORDER — EPHEDRINE SULFATE 50 MG/ML IJ SOLN
INTRAMUSCULAR | Status: DC | PRN
  Administered 2016-03-12: 20 mg via INTRAVENOUS
  Administered 2016-03-12 (×2): 15 mg via INTRAVENOUS

## 2016-03-12 MED ORDER — SODIUM CHLORIDE 0.9 % IJ SOLN
INTRAMUSCULAR | Status: AC
  Filled 2016-03-12: qty 10

## 2016-03-12 SURGICAL SUPPLY — 17 items
CATH ROBINSON RED A/P 16FR (CATHETERS) ×3 IMPLANT
CLOTH BEACON ORANGE TIMEOUT ST (SAFETY) ×3 IMPLANT
CONTAINER PREFILL 10% NBF 60ML (FORM) ×4 IMPLANT
DEVICE MYOSURE LITE (MISCELLANEOUS) IMPLANT
DEVICE MYOSURE REACH (MISCELLANEOUS) ×2 IMPLANT
FILTER ARTHROSCOPY CONVERTOR (FILTER) ×3 IMPLANT
GLOVE BIO SURGEON STRL SZ7 (GLOVE) ×6 IMPLANT
GLOVE BIOGEL PI IND STRL 7.0 (GLOVE) ×1 IMPLANT
GLOVE BIOGEL PI INDICATOR 7.0 (GLOVE) ×2
GOWN STRL REUS W/TWL LRG LVL3 (GOWN DISPOSABLE) ×6 IMPLANT
PACK VAGINAL MINOR WOMEN LF (CUSTOM PROCEDURE TRAY) ×3 IMPLANT
PAD OB MATERNITY 4.3X12.25 (PERSONAL CARE ITEMS) ×3 IMPLANT
SEAL ROD LENS SCOPE MYOSURE (ABLATOR) ×3 IMPLANT
TOWEL OR 17X24 6PK STRL BLUE (TOWEL DISPOSABLE) ×6 IMPLANT
TUBING AQUILEX INFLOW (TUBING) ×3 IMPLANT
TUBING AQUILEX OUTFLOW (TUBING) ×3 IMPLANT
WATER STERILE IRR 1000ML POUR (IV SOLUTION) ×3 IMPLANT

## 2016-03-12 NOTE — Anesthesia Procedure Notes (Signed)
Procedure Name: LMA Insertion Date/Time: 03/12/2016 1:07 PM Performed by: Tobin Chad Pre-anesthesia Checklist: Patient identified, Emergency Drugs available, Suction available and Patient being monitored Patient Re-evaluated:Patient Re-evaluated prior to inductionOxygen Delivery Method: Circle system utilized and Simple face mask Preoxygenation: Pre-oxygenation with 100% oxygen Intubation Type: IV induction and Inhalational induction Ventilation: Mask ventilation without difficulty LMA Size: 4.0 Grade View: Grade II Tube type: Oral Number of attempts: 1 Placement Confirmation: positive ETCO2 and breath sounds checked- equal and bilateral

## 2016-03-12 NOTE — Brief Op Note (Signed)
03/12/2016  1:47 PM  PATIENT:  Caroline Day  76 y.o. female  PRE-OPERATIVE DIAGNOSIS:  polyp  POST-OPERATIVE DIAGNOSIS:  polyp  PROCEDURE:  Procedure(s): DILATATION & CURETTAGE/HYSTEROSCOPY WITH MYOSURE (N/A)  SURGEON:  Surgeon(s) and Role:    * Arvella Nigh, MD - Primary  PHYSICIAN ASSISTANT:   ASSISTANTS: none   ANESTHESIA:   general and paracervical block  EBL:  No intake/output data recorded.  BLOOD ADMINISTERED:none  DRAINS: none   LOCAL MEDICATIONS USED:  XYLOCAINE   SPECIMEN:  Source of Specimen:  uterine polyps and fibroid  DISPOSITION OF SPECIMEN:  PATHOLOGY  COUNTS:  YES  TOURNIQUET:  * No tourniquets in log *  DICTATION: .Note written in paper chart and Other Dictation: Dictation Number [id number]  PLAN OF CARE: Discharge to home after PACU  PATIENT DISPOSITION:  PACU - hemodynamically stable.   Delay start of Pharmacological VTE agent (>24hrs) due to surgical blood loss or risk of bleeding: not applicable

## 2016-03-12 NOTE — Anesthesia Preprocedure Evaluation (Addendum)
Anesthesia Evaluation  Patient identified by MRN, date of birth, ID band Patient awake    Reviewed: Allergy & Precautions, NPO status , Patient's Chart, lab work & pertinent test results, reviewed documented beta blocker date and time   Airway Mallampati: II  TM Distance: >3 FB Neck ROM: Full    Dental  (+) Teeth Intact, Dental Advisory Given, Partial Lower   Pulmonary neg pulmonary ROS,    Pulmonary exam normal breath sounds clear to auscultation       Cardiovascular hypertension, Pt. on medications and Pt. on home beta blockers + Peripheral Vascular Disease  Normal cardiovascular exam Rhythm:Regular Rate:Normal     Neuro/Psych PSYCHIATRIC DISORDERS Anxiety Depression negative neurological ROS     GI/Hepatic negative GI ROS, Neg liver ROS,   Endo/Other  diabetes, Type 2, Insulin DependentMorbid obesity  Renal/GU Renal InsufficiencyRenal disease     Musculoskeletal  (+) Arthritis ,   Abdominal   Peds  Hematology  (+) Blood dyscrasia, anemia ,   Anesthesia Other Findings Day of surgery medications reviewed with the patient.  Reproductive/Obstetrics postmenopausal bleeding and thickened endometrium and possible intrauterine polyp or fibroid                            Anesthesia Physical Anesthesia Plan  ASA: III  Anesthesia Plan: General   Post-op Pain Management:    Induction: Intravenous  Airway Management Planned: LMA  Additional Equipment:   Intra-op Plan:   Post-operative Plan: Extubation in OR  Informed Consent: I have reviewed the patients History and Physical, chart, labs and discussed the procedure including the risks, benefits and alternatives for the proposed anesthesia with the patient or authorized representative who has indicated his/her understanding and acceptance.   Dental advisory given  Plan Discussed with: CRNA  Anesthesia Plan Comments: (Risks/benefits of  general anesthesia discussed with patient including risk of damage to teeth, lips, gum, and tongue, nausea/vomiting, allergic reactions to medications, and the possibility of heart attack, stroke and death.  All patient questions answered.  Patient wishes to proceed.)        Anesthesia Quick Evaluation

## 2016-03-12 NOTE — Transfer of Care (Signed)
Immediate Anesthesia Transfer of Care Note  Patient: Caroline Day  Procedure(s) Performed: Procedure(s): DILATATION & CURETTAGE/HYSTEROSCOPY WITH MYOSURE (N/A)  Patient Location: PACU  Anesthesia Type:General  Level of Consciousness: awake, sedated and patient cooperative  Airway & Oxygen Therapy: Patient Spontanous Breathing and Patient connected to nasal cannula oxygen  Post-op Assessment: Report given to RN and Post -op Vital signs reviewed and stable  Post vital signs: Reviewed and stable  Last Vitals:  Vitals:   03/12/16 1213  BP: 134/62  Pulse: 68  Resp: 20  Temp: 36.7 C    Last Pain:  Vitals:   03/12/16 1213  TempSrc: Oral      Patients Stated Pain Goal: 4 ([id number])  Complications: No apparent anesthesia complications

## 2016-03-12 NOTE — H&P (Signed)
  History and physical exam unchanged 

## 2016-03-12 NOTE — Progress Notes (Signed)
Dr Marcell Barlow notified

## 2016-03-12 NOTE — Discharge Instructions (Signed)

## 2016-03-12 NOTE — Anesthesia Postprocedure Evaluation (Signed)
Anesthesia Post Note  Patient: Caroline Day  Procedure(s) Performed: Procedure(s) (LRB): DILATATION & CURETTAGE/HYSTEROSCOPY WITH MYOSURE (N/A)  Patient location during evaluation: PACU Anesthesia Type: General Level of consciousness: awake and alert Pain management: pain level controlled Vital Signs Assessment: post-procedure vital signs reviewed and stable Respiratory status: spontaneous breathing, nonlabored ventilation, respiratory function stable and patient connected to nasal cannula oxygen Cardiovascular status: blood pressure returned to baseline and stable Postop Assessment: no signs of nausea or vomiting Anesthetic complications: no Comments: Rocky post-op course. Hypoglycemic requiring 1.5 amps of D50 and PO intake. Also some intermittent hypotension requiring fluid bolus. After long stay in PACU and discussions with patient and her husband, she wishes to d/c to home. Will have close supervision by spouse at home and told to seek medical attention at [facility] ASAP for any issues tonight.         Last Vitals:  Vitals:   03/12/16 1630 03/12/16 1645  BP: (!) 82/38 (!) 100/47  Pulse: 62 60  Resp: 19 14  Temp:      Last Pain:  Vitals:   03/12/16 1445  TempSrc:   PainSc: 0-No pain   Pain Goal: Patients Stated Pain Goal: 4 (03/12/16 1213)               Montez Hageman

## 2016-03-13 ENCOUNTER — Encounter (HOSPITAL_COMMUNITY): Payer: Self-pay | Admitting: Obstetrics and Gynecology

## 2016-03-13 LAB — GLUCOSE, CAPILLARY: Glucose-Capillary: 22 mg/dL — CL (ref 65–99)

## 2016-03-13 NOTE — Op Note (Signed)
NAME:  [name], [name] NO.:  [id number]  MEDICAL RECORD NO.:  [id number]  LOCATION:                                 FACILITY:  PHYSICIAN:  Darlyn Chamber, M.D.        DATE OF BIRTH:  DATE OF PROCEDURE:  03/12/2016 DATE OF DISCHARGE:                              OPERATIVE REPORT   PREOPERATIVE DIAGNOSIS:  Postmenopausal bleeding with evidence of intrauterine polyps and/or fibroids.  POSTOPERATIVE DIAGNOSIS:  Postmenopausal bleeding with evidence of intrauterine polyps and/or fibroids with a submucosal fibroid and several endometrial polyps.  OPERATIVE PROCEDURE:  Paracervical block.  Cervical dilatation. Hysteroscopy with resection of endometrial polyps and part of the submucosal fibroid.  This was followed by uterine curettings.  SURGEON:  Darlyn Chamber, M.D.  ANESTHESIA:  General with paracervical block.  BLOOD LOSS:  Minimal.  TOTAL DEFICIT:  610 mL.  PACKS AND DRAINS:  None.  INTRAOPERATIVE BLOOD PLACED:  None.  COMPLICATIONS:  None.  INDICATIONS:  Dictated in history and physical.  PROCEDURE IN DETAIL:  The patient was taken to the OR and placed in a supine position.  After satisfactory level of general anesthesia obtained, she had been placed in the dorsal lithotomy position.  The perineum and vagina prepped out with Betadine and draped as a sterile field.  Speculum was placed in vaginal vault.  We used several speculums and were eventually able to identify the cervix.  We secured it with a single-tooth tenaculum.  Paracervical block with 1% Xylocaine was instituted.  Uterus sounded to approximately 9 cm.  Cervix was serially dilated to a size 23 Pratt dilator.  Hysteroscope was introduced. Intrauterine cavity was distended using saline.  She had several small polyps.  Endometrium was really kind of smooth and atrophic.  She did have a submucosal fibroid with impingement on the intrauterine cavity. At this point in time, we brought in the  MyoSure.  First, both polyps were resected.  Then, we started resecting the submucosal fibroid.  We took it down to the uterine lining removing probably at least 3/4th of the fibroid.  We then resected parts of the endometrium.  At this point in time, all the polyps had been removed.  The fibroid was flushed with the endometrial cavity at this point.  We removed the hysteroscope.  We obtained uterine curettings, there was minimal tissue with that.  All this is to be sent to Pathology. At this point in time, single-tooth and speculum were then removed.  The patient was taken out of the dorsal lithotomy position.  Once alert and extubated, transferred to recovery room in good condition.  Sponge, instrument, and needle counts were reported as correct by circulating nurse.     Darlyn Chamber, M.D.     JSM/MEDQ  D:  03/12/2016  T:  03/13/2016  Job:  [id number]

## 2016-03-31 ENCOUNTER — Ambulatory Visit: Payer: Medicare Other | Attending: Gynecology | Admitting: Gynecology

## 2016-03-31 ENCOUNTER — Encounter: Payer: Self-pay | Admitting: Gynecology

## 2016-03-31 VITALS — BP 154/77 | HR 61 | Temp 98.2°F | Resp 18 | Ht 68.5 in

## 2016-03-31 DIAGNOSIS — E669 Obesity, unspecified: Secondary | ICD-10-CM | POA: Insufficient documentation

## 2016-03-31 DIAGNOSIS — E11621 Type 2 diabetes mellitus with foot ulcer: Secondary | ICD-10-CM | POA: Insufficient documentation

## 2016-03-31 DIAGNOSIS — Z881 Allergy status to other antibiotic agents status: Secondary | ICD-10-CM | POA: Insufficient documentation

## 2016-03-31 DIAGNOSIS — E119 Type 2 diabetes mellitus without complications: Secondary | ICD-10-CM | POA: Insufficient documentation

## 2016-03-31 DIAGNOSIS — I10 Essential (primary) hypertension: Secondary | ICD-10-CM | POA: Insufficient documentation

## 2016-03-31 DIAGNOSIS — Z79899 Other long term (current) drug therapy: Secondary | ICD-10-CM | POA: Diagnosis not present

## 2016-03-31 DIAGNOSIS — N8502 Endometrial intraepithelial neoplasia [EIN]: Secondary | ICD-10-CM | POA: Diagnosis present

## 2016-03-31 DIAGNOSIS — Z794 Long term (current) use of insulin: Secondary | ICD-10-CM | POA: Diagnosis not present

## 2016-03-31 DIAGNOSIS — E1151 Type 2 diabetes mellitus with diabetic peripheral angiopathy without gangrene: Secondary | ICD-10-CM | POA: Insufficient documentation

## 2016-03-31 DIAGNOSIS — K439 Ventral hernia without obstruction or gangrene: Secondary | ICD-10-CM

## 2016-03-31 NOTE — Progress Notes (Signed)
Consult Note: Gyn-Onc   Caroline Day 76 y.o. female  Chief Complaint  Patient presents with  . 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  Plan: I lengthy discussion with the patient and her husband regarding the natural history of atypical complex hyperplasia. They understand that the standard of care would be to performing a hysterectomy. However given her comorbid conditions I believe she is at very high risk undergo major surgery and therefore would recommend an alternative approach utilizing progesterone therapy. Given her pre-existing obesity, would prefer not to use Megace and therefore we will recommend she be treated with Provera 10 mg daily on a continuous spaces. In order to reassess her endometrium we will obtain an ultrasound assessing an endometrial stripe in 3 months. The patient report any new abnormal bleeding. Questions are answered.     HPI: 76 year old seen in consultation request of Dr. Arvella Nigh regarding management of complex endometrial hyperplasia with atypia. Patient had postmenopausal bleeding and apparently had a normal endometrial biopsy. She was noted to have thickened endometrial stripe and subsequently underwent a D&C and resection of endometrial polyp on 03/12/2016. At the same time she went underwent endometrial ablation. Final pathology returned showing an endometrial polyp with foci of complex atypical hyperplasia.  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  Review of Systems:10 point review of systems is negative except as noted in interval history.   Vitals: Blood pressure (!) 154/77, pulse 61, temperature 98.2 F (36.8 C), temperature source Oral, resp. rate 18, height 5' 8.5" (1.74 m), SpO2 99  %.  Physical Exam: General : The patient is a healthy woman in no acute distress. Morbidly obese. HEENT: normocephalic, extraoccular movements normal; neck is supple without thyromegally  Lynphnodes: Supraclavicular and inguinal nodes not enlarged  Abdomen: Soft, non-tender, no ascites, no organomegally, there is a large ventral hernia on the right which is easily reducible. Pelvic:  EGBUS: Normal female  Vagina: Normal, no lesions  Urethra and Bladder: Normal, non-tender  Cervix: Normal Uterus: Difficult to assess given the patient's habitus. Bi-manual examination: Non-tender; no adenxal masses or nodularity  Rectal: normal sphincter tone, no masses, no blood  Lower extremities: No edema or varicosities. Normal range of motion      Allergies  Allergen Reactions  . Niacin And Related Rash  . Vancomycin Itching and Rash    Past Medical History:  Diagnosis Date  . Abrasion    left thigh from depends  . Anemia   . Anxiety   . Arthritis   . Depression   . Diabetes mellitus without complication ([place])   . Gout   . High cholesterol   . Hypertension   . Neuropathy ([place])   . Peripheral vascular disease [facility])     Past Surgical History:  Procedure Laterality Date  . AMPUTATION Right    toes  . CATARACT EXTRACTION, BILATERAL    . COLONOSCOPY    . DILATATION & CURETTAGE/HYSTEROSCOPY WITH MYOSURE N/A 03/12/2016   Procedure: DILATATION & CURETTAGE/HYSTEROSCOPY WITH MYOSURE;  Surgeon: Arvella Nigh, MD;  Location: [place] [facility];  Service: Gynecology;  Laterality: N/A;  . FOOT SURGERY Right    ulcer  . REVERSE SHOULDER ARTHROPLASTY Right   . TOTAL KNEE ARTHROPLASTY     right side times 2, left side  . TOTAL SHOULDER ARTHROPLASTY Left   . UPPER GI  ENDOSCOPY      Current Outpatient Prescriptions  Medication Sig Dispense Refill  . acetaminophen (TYLENOL) 500 MG tablet Take 500-1,000 mg by mouth every 6 (six) hours as needed (for pain.).    Marland Kitchen allopurinol (ZYLOPRIM) 300 MG tablet Take  300 mg by mouth daily.    . clotrimazole-betamethasone (LOTRISONE) cream Apply 1 application topically 2 (two) times daily as needed (for sore spot).    . ferrous sulfate 325 (65 FE) MG tablet Take 325 mg by mouth daily with breakfast.    . furosemide (LASIX) 20 MG tablet Take 20 mg by mouth daily.    Marland Kitchen gabapentin (NEURONTIN) 600 MG tablet Take 600-1,200 mg by mouth 2 (two) times daily. 600 mg in the morning & 1200 mg in the evening    . HUMALOG MIX 75/25 KWIKPEN (75-25) 100 UNIT/ML Kwikpen Inject 35-45 Units into the skin 2 (two) times daily. 45 units in the morning and 35 units in the evening  5  . imipramine (TOFRANIL) 25 MG tablet Take 25 mg by mouth at bedtime.    Marland Kitchen lisinopril (PRINIVIL,ZESTRIL) 10 MG tablet Take 10 mg by mouth daily.    . metoprolol succinate (TOPROL-XL) 50 MG 24 hr tablet Take 50 mg by mouth daily. Take with or immediately following a meal.    . oxyCODONE-acetaminophen (PERCOCET) 7.5-325 MG tablet Take 1 tablet by mouth every 8 (eight) hours as needed for severe pain. 10 tablet 0  . polyvinyl alcohol (LIQUIFILM TEARS) 1.4 % ophthalmic solution Place 1 drop into both eyes 3 (three) times daily as needed for dry eyes.    Marland Kitchen sertraline (ZOLOFT) 50 MG tablet Take 50 mg by mouth at bedtime.    . Vitamin D, Ergocalciferol, (DRISDOL) 50000 units CAPS capsule Take 50,000 Units by mouth every Saturday.     No current facility-administered medications for this visit.     Social History   Social History  . Marital status: Married    Spouse name: N/A  . Number of children: N/A  . Years of education: N/A   Occupational History  . Not on file.   Social History Main Topics  . Smoking status: Never Smoker  . Smokeless tobacco: Never Used  . Alcohol use Yes     Comment: rare  . Drug use: No  . Sexual activity: Not on file   Other Topics Concern  . Not on file   Social History Narrative  . No narrative on file    History reviewed. No pertinent family  history.    Marti Sleigh, MD 03/31/2016, 2:13 PM      Consult Note: Gyn-Onc   Caroline Day 76 y.o. female  Chief Complaint  Patient presents with  . Complex Atypical Endometrial Hyperplasia    Assessment :  Plan:  Interval History:   HPI:  Review of Systems:10 point review of systems is negative except as noted in interval history.   Vitals: Blood pressure (!) 154/77, pulse 61, temperature 98.2 F (36.8 C), temperature source Oral, resp. rate 18, height 5' 8.5" (1.74 m), SpO2 99 %.  Physical Exam: General : The patient is a healthy woman in no acute distress.  HEENT: normocephalic, extraoccular movements normal; neck is supple without thyromegally  Lynphnodes: Supraclavicular and inguinal nodes not enlarged  Abdomen: Soft, non-tender, no ascites, no organomegally, no masses, no hernias  Pelvic:  EGBUS: Normal female  Vagina: Normal, no lesions  Urethra and Bladder: Normal, non-tender  Cervix: Surgically absent  Uterus: Surgically absent  Bi-manual examination: Non-tender; no adenxal masses or nodularity  Rectal: normal sphincter tone, no masses, no blood  Lower extremities: No edema or varicosities. Normal range of motion      Allergies  Allergen Reactions  . Niacin And Related Rash  . Vancomycin Itching and Rash    Past Medical History:  Diagnosis Date  . Abrasion    left thigh from depends  . Anemia   . Anxiety   . Arthritis   . Depression   . Diabetes mellitus without complication ([place])   . Gout   . High cholesterol   . Hypertension   . Neuropathy ([place])   . Peripheral vascular disease [facility])     Past Surgical History:  Procedure Laterality Date  . AMPUTATION Right    toes  . CATARACT EXTRACTION, BILATERAL    . COLONOSCOPY    . DILATATION & CURETTAGE/HYSTEROSCOPY WITH MYOSURE N/A 03/12/2016   Procedure: DILATATION & CURETTAGE/HYSTEROSCOPY WITH MYOSURE;  Surgeon: Arvella Nigh, MD;  Location: [place] [facility];  Service: Gynecology;   Laterality: N/A;  . FOOT SURGERY Right    ulcer  . REVERSE SHOULDER ARTHROPLASTY Right   . TOTAL KNEE ARTHROPLASTY     right side times 2, left side  . TOTAL SHOULDER ARTHROPLASTY Left   . UPPER GI ENDOSCOPY      Current Outpatient Prescriptions  Medication Sig Dispense Refill  . acetaminophen (TYLENOL) 500 MG tablet Take 500-1,000 mg by mouth every 6 (six) hours as needed (for pain.).    Marland Kitchen allopurinol (ZYLOPRIM) 300 MG tablet Take 300 mg by mouth daily.    . clotrimazole-betamethasone (LOTRISONE) cream Apply 1 application topically 2 (two) times daily as needed (for sore spot).    . ferrous sulfate 325 (65 FE) MG tablet Take 325 mg by mouth daily with breakfast.    . furosemide (LASIX) 20 MG tablet Take 20 mg by mouth daily.    Marland Kitchen gabapentin (NEURONTIN) 600 MG tablet Take 600-1,200 mg by mouth 2 (two) times daily. 600 mg in the morning & 1200 mg in the evening    . HUMALOG MIX 75/25 KWIKPEN (75-25) 100 UNIT/ML Kwikpen Inject 35-45 Units into the skin 2 (two) times daily. 45 units in the morning and 35 units in the evening  5  . imipramine (TOFRANIL) 25 MG tablet Take 25 mg by mouth at bedtime.    Marland Kitchen lisinopril (PRINIVIL,ZESTRIL) 10 MG tablet Take 10 mg by mouth daily.    . metoprolol succinate (TOPROL-XL) 50 MG 24 hr tablet Take 50 mg by mouth daily. Take with or immediately following a meal.    . oxyCODONE-acetaminophen (PERCOCET) 7.5-325 MG tablet Take 1 tablet by mouth every 8 (eight) hours as needed for severe pain. 10 tablet 0  . polyvinyl alcohol (LIQUIFILM TEARS) 1.4 % ophthalmic solution Place 1 drop into both eyes 3 (three) times daily as needed for dry eyes.    Marland Kitchen sertraline (ZOLOFT) 50 MG tablet Take 50 mg by mouth at bedtime.    . Vitamin D, Ergocalciferol, (DRISDOL) 50000 units CAPS capsule Take 50,000 Units by mouth every Saturday.     No current facility-administered medications for this visit.     Social History   Social History  . Marital status: Married    Spouse  name: N/A  . Number of children: N/A  . Years of education: N/A   Occupational History  . Not on file.   Social History Main Topics  . Smoking status: Never Smoker  . Smokeless tobacco: Never  Used  . Alcohol use Yes     Comment: rare  . Drug use: No  . Sexual activity: Not on file   Other Topics Concern  . Not on file   Social History Narrative  . No narrative on file    History reviewed. No pertinent family history.    Marti Sleigh, MD 03/31/2016, 2:14 PM

## 2016-03-31 NOTE — Patient Instructions (Signed)
Please call [phone] to schedule transvaginal ultrasound for May 2019. And then follow up with Dr Fermin Schwab after ultrasound is completed.

## 2016-04-02 ENCOUNTER — Ambulatory Visit: Payer: Medicare Other | Admitting: Gynecologic Oncology

## 2016-06-13 ENCOUNTER — Other Ambulatory Visit: Payer: Self-pay

## 2016-06-17 ENCOUNTER — Telehealth: Payer: Self-pay

## 2016-06-17 ENCOUNTER — Ambulatory Visit (HOSPITAL_COMMUNITY)
Admission: RE | Admit: 2016-06-17 | Discharge: 2016-06-17 | Disposition: A | Discharge status: Home / Self Care | Payer: Medicare Other | Source: Ambulatory Visit | Attending: Gynecology | Admitting: Gynecology

## 2016-06-17 DIAGNOSIS — N8502 Endometrial intraepithelial neoplasia [EIN]: Secondary | ICD-10-CM | POA: Diagnosis not present

## 2016-06-17 DIAGNOSIS — K439 Ventral hernia without obstruction or gangrene: Secondary | ICD-10-CM

## 2016-06-17 NOTE — Telephone Encounter (Signed)
Told Caroline Day that Dr. Radene Knee is out of the office until 06-23-16.  He has to order [place].  Pt stated that she will go ahead and get [place] at [facility] this am for DR. CP visit 06-27-16.

## 2016-06-27 ENCOUNTER — Ambulatory Visit: Payer: Medicare Other | Attending: Gynecology | Admitting: Gynecology

## 2016-06-27 ENCOUNTER — Encounter: Payer: Self-pay | Admitting: Gynecology

## 2016-06-27 VITALS — BP 119/60 | HR 70 | Temp 98.6°F | Resp 20 | Wt 294.0 lb

## 2016-06-27 DIAGNOSIS — M109 Gout, unspecified: Secondary | ICD-10-CM | POA: Insufficient documentation

## 2016-06-27 DIAGNOSIS — L97509 Non-pressure chronic ulcer of other part of unspecified foot with unspecified severity: Secondary | ICD-10-CM | POA: Diagnosis not present

## 2016-06-27 DIAGNOSIS — Z96653 Presence of artificial knee joint, bilateral: Secondary | ICD-10-CM | POA: Diagnosis not present

## 2016-06-27 DIAGNOSIS — R938 Abnormal findings on diagnostic imaging of other specified body structures: Secondary | ICD-10-CM

## 2016-06-27 DIAGNOSIS — M1990 Unspecified osteoarthritis, unspecified site: Secondary | ICD-10-CM | POA: Diagnosis not present

## 2016-06-27 DIAGNOSIS — E11621 Type 2 diabetes mellitus with foot ulcer: Secondary | ICD-10-CM | POA: Diagnosis not present

## 2016-06-27 DIAGNOSIS — F419 Anxiety disorder, unspecified: Secondary | ICD-10-CM | POA: Insufficient documentation

## 2016-06-27 DIAGNOSIS — Z9889 Other specified postprocedural states: Secondary | ICD-10-CM | POA: Diagnosis not present

## 2016-06-27 DIAGNOSIS — Z881 Allergy status to other antibiotic agents status: Secondary | ICD-10-CM | POA: Insufficient documentation

## 2016-06-27 DIAGNOSIS — Z96619 Presence of unspecified artificial shoulder joint: Secondary | ICD-10-CM | POA: Diagnosis not present

## 2016-06-27 DIAGNOSIS — Z888 Allergy status to other drugs, medicaments and biological substances status: Secondary | ICD-10-CM | POA: Diagnosis not present

## 2016-06-27 DIAGNOSIS — Z89429 Acquired absence of other toe(s), unspecified side: Secondary | ICD-10-CM | POA: Diagnosis not present

## 2016-06-27 DIAGNOSIS — N8502 Endometrial intraepithelial neoplasia [EIN]: Secondary | ICD-10-CM | POA: Diagnosis not present

## 2016-06-27 DIAGNOSIS — D649 Anemia, unspecified: Secondary | ICD-10-CM | POA: Diagnosis not present

## 2016-06-27 DIAGNOSIS — I10 Essential (primary) hypertension: Secondary | ICD-10-CM | POA: Diagnosis not present

## 2016-06-27 DIAGNOSIS — E1140 Type 2 diabetes mellitus with diabetic neuropathy, unspecified: Secondary | ICD-10-CM | POA: Diagnosis not present

## 2016-06-27 DIAGNOSIS — Z6841 Body Mass Index (BMI) 40.0 and over, adult: Secondary | ICD-10-CM | POA: Insufficient documentation

## 2016-06-27 DIAGNOSIS — F329 Major depressive disorder, single episode, unspecified: Secondary | ICD-10-CM | POA: Diagnosis not present

## 2016-06-27 DIAGNOSIS — Z9841 Cataract extraction status, right eye: Secondary | ICD-10-CM | POA: Diagnosis not present

## 2016-06-27 DIAGNOSIS — Z9842 Cataract extraction status, left eye: Secondary | ICD-10-CM | POA: Diagnosis not present

## 2016-06-27 DIAGNOSIS — E7800 Pure hypercholesterolemia, unspecified: Secondary | ICD-10-CM | POA: Diagnosis not present

## 2016-06-27 DIAGNOSIS — E1151 Type 2 diabetes mellitus with diabetic peripheral angiopathy without gangrene: Secondary | ICD-10-CM | POA: Diagnosis not present

## 2016-06-27 DIAGNOSIS — Z794 Long term (current) use of insulin: Secondary | ICD-10-CM | POA: Diagnosis not present

## 2016-06-27 NOTE — Patient Instructions (Addendum)
Continue Provera 10 mg a day. We will schedule an ultrasound for 3-4 months and I will see her thereafter. Please contact us if you have any vaginal bleeding.  Your ultrasound is scheduled for July 31 at [place] to [facility] at 1:45pm to get checked in and arrive with a full bladder.

## 2016-06-27 NOTE — Progress Notes (Signed)
Consult Note: Gyn-Onc   Caroline Day 76 y.o. female  No chief complaint on file.   Assessment :Complex atypical hyperplasia of the endometrium status post D&C and endometrial ablation.  Plan: I reviewed the recent ultrasound with the patient and her husband. Endometrial stripe is still thickened at 9 mm. I would recommend we continue Provera 10 mg daily and repeat an ultrasound again in 3-4 months. If the endometrial stripe is still significantly thickened I would consider increasing her ovarian dosed to 20 mg a day.  Interval history: Patient returns today along with her husband for further evaluation of endometrial hyperplasia with atypia. At our initial visit we elected to treat the patient with Provera 10 mg a day. She's been tolerating the Provera without difficulty. She denies any vaginal bleeding or any pelvic pain or pressure. She had a repeat ultrasound recently that shows the endometrial stripe to be 9 mm.   HPI: 76 year old seen in consultation request of Dr. Arvella Nigh regarding management of complex endometrial hyperplasia with atypia. Patient had postmenopausal bleeding and apparently had a normal endometrial biopsy. She was noted to have thickened endometrial stripe and subsequently underwent a D&C and resection of endometrial polyp on 03/12/2016. At the same time she went underwent endometrial ablation. Final pathology returned showing an endometrial polyp with foci of complex atypical hyperplasia.  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  Given her medical comorbidities and high surgical risk we elected treat the patient with Provera 10 mg daily (chose to avoid using Megace given the patient's obesity)  Review of Systems:10 point review of systems is negative except as noted in interval history.   Vitals: There  were no vitals taken for this visit.  Physical Exam: General : The patient is a healthy woman in no acute distress. Morbidly obese. HEENT: normocephalic, extraoccular movements normal; neck is supple without thyromegally  Lynphnodes: Supraclavicular and inguinal nodes not enlarged  Abdomen: Soft, non-tender, no ascites, no organomegally, there is a large ventral hernia on the right which is easily reducible.  Lower extremities: No edema or varicosities. Normal range of motion      Allergies  Allergen Reactions  . Niacin And Related Rash  . Vancomycin Itching and Rash    Past Medical History:  Diagnosis Date  . Abrasion    left thigh from depends  . Anemia   . Anxiety   . Arthritis   . Depression   . Diabetes mellitus without complication ([place])   . Gout   . High cholesterol   . Hypertension   . Neuropathy ([place])   . Peripheral vascular disease [facility])     Past Surgical History:  Procedure Laterality Date  . AMPUTATION Right    toes  . CATARACT EXTRACTION, BILATERAL    . COLONOSCOPY    . DILATATION & CURETTAGE/HYSTEROSCOPY WITH MYOSURE N/A 03/12/2016   Procedure: DILATATION & CURETTAGE/HYSTEROSCOPY WITH MYOSURE;  Surgeon: Arvella Nigh, MD;  Location: [place] [facility];  Service: Gynecology;  Laterality: N/A;  . FOOT SURGERY Right    ulcer  . REVERSE SHOULDER ARTHROPLASTY Right   . TOTAL KNEE ARTHROPLASTY     right side times 2, left side  . TOTAL SHOULDER ARTHROPLASTY Left   . UPPER GI ENDOSCOPY      Current Outpatient Prescriptions  Medication Sig Dispense Refill  . acetaminophen (TYLENOL) 500 MG tablet Take 500-1,000 mg by mouth every 6 (six) hours as  needed (for pain.).    Marland Kitchen allopurinol (ZYLOPRIM) 300 MG tablet Take 300 mg by mouth daily.    . clotrimazole-betamethasone (LOTRISONE) cream Apply 1 application topically 2 (two) times daily as needed (for sore spot).    . ferrous sulfate 325 (65 FE) MG tablet Take 325 mg by mouth daily with breakfast.    . furosemide (LASIX) 20  MG tablet Take 20 mg by mouth daily.    Marland Kitchen gabapentin (NEURONTIN) 600 MG tablet Take 600-1,200 mg by mouth 2 (two) times daily. 600 mg in the morning & 1200 mg in the evening    . HUMALOG MIX 75/25 KWIKPEN (75-25) 100 UNIT/ML Kwikpen Inject 35-45 Units into the skin 2 (two) times daily. 45 units in the morning and 35 units in the evening  5  . imipramine (TOFRANIL) 25 MG tablet Take 25 mg by mouth at bedtime.    Marland Kitchen lisinopril (PRINIVIL,ZESTRIL) 10 MG tablet Take 10 mg by mouth daily.    . metoprolol succinate (TOPROL-XL) 50 MG 24 hr tablet Take 50 mg by mouth daily. Take with or immediately following a meal.    . oxyCODONE-acetaminophen (PERCOCET) 7.5-325 MG tablet Take 1 tablet by mouth every 8 (eight) hours as needed for severe pain. 10 tablet 0  . polyvinyl alcohol (LIQUIFILM TEARS) 1.4 % ophthalmic solution Place 1 drop into both eyes 3 (three) times daily as needed for dry eyes.    Marland Kitchen sertraline (ZOLOFT) 50 MG tablet Take 50 mg by mouth at bedtime.    . Vitamin D, Ergocalciferol, (DRISDOL) 50000 units CAPS capsule Take 50,000 Units by mouth every Saturday.     No current facility-administered medications for this visit.     Social History   Social History  . Marital status: Married    Spouse name: N/A  . Number of children: N/A  . Years of education: N/A   Occupational History  . Not on file.   Social History Main Topics  . Smoking status: Never Smoker  . Smokeless tobacco: Never Used  . Alcohol use Yes     Comment: rare  . Drug use: No  . Sexual activity: Not on file   Other Topics Concern  . Not on file   Social History Narrative  . No narrative on file    No family history on file.    Marti Sleigh, MD 06/27/2016, 10:24 AM      Consult Note: Gyn-Onc   Caroline Day 76 y.o. female  No chief complaint on file.   Assessment :  Plan:  Interval History:   HPI:  Review of Systems:10 point review of systems is negative except as noted in  interval history.   Vitals: There were no vitals taken for this visit.  Physical Exam: General : The patient is a healthy woman in no acute distress.  HEENT: normocephalic, extraoccular movements normal; neck is supple without thyromegally  Lynphnodes: Supraclavicular and inguinal nodes not enlarged  Abdomen: Soft, non-tender, no ascites, no organomegally, no masses, no hernias  Pelvic:  EGBUS: Normal female  Vagina: Normal, no lesions  Urethra and Bladder: Normal, non-tender  Cervix: Surgically absent  Uterus: Surgically absent  Bi-manual examination: Non-tender; no adenxal masses or nodularity  Rectal: normal sphincter tone, no masses, no blood  Lower extremities: No edema or varicosities. Normal range of motion      Allergies  Allergen Reactions  . Niacin And Related Rash  . Vancomycin Itching and Rash    Past Medical History:  Diagnosis  Date  . Abrasion    left thigh from depends  . Anemia   . Anxiety   . Arthritis   . Depression   . Diabetes mellitus without complication ([place])   . Gout   . High cholesterol   . Hypertension   . Neuropathy ([place])   . Peripheral vascular disease [facility])     Past Surgical History:  Procedure Laterality Date  . AMPUTATION Right    toes  . CATARACT EXTRACTION, BILATERAL    . COLONOSCOPY    . DILATATION & CURETTAGE/HYSTEROSCOPY WITH MYOSURE N/A 03/12/2016   Procedure: DILATATION & CURETTAGE/HYSTEROSCOPY WITH MYOSURE;  Surgeon: Arvella Nigh, MD;  Location: [place] [facility];  Service: Gynecology;  Laterality: N/A;  . FOOT SURGERY Right    ulcer  . REVERSE SHOULDER ARTHROPLASTY Right   . TOTAL KNEE ARTHROPLASTY     right side times 2, left side  . TOTAL SHOULDER ARTHROPLASTY Left   . UPPER GI ENDOSCOPY      Current Outpatient Prescriptions  Medication Sig Dispense Refill  . acetaminophen (TYLENOL) 500 MG tablet Take 500-1,000 mg by mouth every 6 (six) hours as needed (for pain.).    Marland Kitchen allopurinol (ZYLOPRIM) 300 MG tablet Take 300 mg by  mouth daily.    . clotrimazole-betamethasone (LOTRISONE) cream Apply 1 application topically 2 (two) times daily as needed (for sore spot).    . ferrous sulfate 325 (65 FE) MG tablet Take 325 mg by mouth daily with breakfast.    . furosemide (LASIX) 20 MG tablet Take 20 mg by mouth daily.    Marland Kitchen gabapentin (NEURONTIN) 600 MG tablet Take 600-1,200 mg by mouth 2 (two) times daily. 600 mg in the morning & 1200 mg in the evening    . HUMALOG MIX 75/25 KWIKPEN (75-25) 100 UNIT/ML Kwikpen Inject 35-45 Units into the skin 2 (two) times daily. 45 units in the morning and 35 units in the evening  5  . imipramine (TOFRANIL) 25 MG tablet Take 25 mg by mouth at bedtime.    Marland Kitchen lisinopril (PRINIVIL,ZESTRIL) 10 MG tablet Take 10 mg by mouth daily.    . metoprolol succinate (TOPROL-XL) 50 MG 24 hr tablet Take 50 mg by mouth daily. Take with or immediately following a meal.    . oxyCODONE-acetaminophen (PERCOCET) 7.5-325 MG tablet Take 1 tablet by mouth every 8 (eight) hours as needed for severe pain. 10 tablet 0  . polyvinyl alcohol (LIQUIFILM TEARS) 1.4 % ophthalmic solution Place 1 drop into both eyes 3 (three) times daily as needed for dry eyes.    Marland Kitchen sertraline (ZOLOFT) 50 MG tablet Take 50 mg by mouth at bedtime.    . Vitamin D, Ergocalciferol, (DRISDOL) 50000 units CAPS capsule Take 50,000 Units by mouth every Saturday.     No current facility-administered medications for this visit.     Social History   Social History  . Marital status: Married    Spouse name: N/A  . Number of children: N/A  . Years of education: N/A   Occupational History  . Not on file.   Social History Main Topics  . Smoking status: Never Smoker  . Smokeless tobacco: Never Used  . Alcohol use Yes     Comment: rare  . Drug use: No  . Sexual activity: Not on file   Other Topics Concern  . Not on file   Social History Narrative  . No narrative on file    No family history on file.    Marti Sleigh,  MD 06/27/2016, 10:24 AM

## 2016-09-09 ENCOUNTER — Ambulatory Visit (HOSPITAL_COMMUNITY): Payer: Medicare Other

## 2016-09-15 ENCOUNTER — Ambulatory Visit: Payer: Medicare Other | Admitting: Gynecology

## 2016-11-14 ENCOUNTER — Ambulatory Visit (HOSPITAL_COMMUNITY)
Admission: RE | Admit: 2016-11-14 | Discharge: 2016-11-14 | Disposition: A | Discharge status: Home / Self Care | Payer: Medicare Other | Source: Ambulatory Visit | Attending: Gynecologic Oncology | Admitting: Gynecologic Oncology

## 2016-11-14 DIAGNOSIS — D259 Leiomyoma of uterus, unspecified: Secondary | ICD-10-CM | POA: Diagnosis not present

## 2016-11-14 DIAGNOSIS — N950 Postmenopausal bleeding: Secondary | ICD-10-CM | POA: Diagnosis not present

## 2016-11-14 DIAGNOSIS — N8502 Endometrial intraepithelial neoplasia [EIN]: Secondary | ICD-10-CM | POA: Diagnosis present

## 2016-11-17 ENCOUNTER — Encounter: Payer: Self-pay | Admitting: Gynecology

## 2016-11-17 ENCOUNTER — Ambulatory Visit: Payer: Medicare Other | Attending: Gynecology | Admitting: Gynecology

## 2016-11-17 VITALS — BP 139/57 | HR 53 | Temp 98.0°F | Resp 20 | Ht 68.5 in | Wt 290.0 lb

## 2016-11-17 DIAGNOSIS — N950 Postmenopausal bleeding: Secondary | ICD-10-CM | POA: Insufficient documentation

## 2016-11-17 DIAGNOSIS — E11621 Type 2 diabetes mellitus with foot ulcer: Secondary | ICD-10-CM | POA: Diagnosis not present

## 2016-11-17 DIAGNOSIS — Z96619 Presence of unspecified artificial shoulder joint: Secondary | ICD-10-CM | POA: Insufficient documentation

## 2016-11-17 DIAGNOSIS — Z96653 Presence of artificial knee joint, bilateral: Secondary | ICD-10-CM | POA: Insufficient documentation

## 2016-11-17 DIAGNOSIS — Z881 Allergy status to other antibiotic agents status: Secondary | ICD-10-CM | POA: Insufficient documentation

## 2016-11-17 DIAGNOSIS — Z794 Long term (current) use of insulin: Secondary | ICD-10-CM | POA: Diagnosis not present

## 2016-11-17 DIAGNOSIS — Z888 Allergy status to other drugs, medicaments and biological substances status: Secondary | ICD-10-CM | POA: Diagnosis not present

## 2016-11-17 DIAGNOSIS — I10 Essential (primary) hypertension: Secondary | ICD-10-CM | POA: Insufficient documentation

## 2016-11-17 DIAGNOSIS — F329 Major depressive disorder, single episode, unspecified: Secondary | ICD-10-CM | POA: Diagnosis not present

## 2016-11-17 DIAGNOSIS — E1151 Type 2 diabetes mellitus with diabetic peripheral angiopathy without gangrene: Secondary | ICD-10-CM | POA: Insufficient documentation

## 2016-11-17 DIAGNOSIS — Z79899 Other long term (current) drug therapy: Secondary | ICD-10-CM | POA: Diagnosis not present

## 2016-11-17 DIAGNOSIS — K439 Ventral hernia without obstruction or gangrene: Secondary | ICD-10-CM | POA: Diagnosis not present

## 2016-11-17 DIAGNOSIS — Z89421 Acquired absence of other right toe(s): Secondary | ICD-10-CM | POA: Diagnosis not present

## 2016-11-17 DIAGNOSIS — E7800 Pure hypercholesterolemia, unspecified: Secondary | ICD-10-CM | POA: Insufficient documentation

## 2016-11-17 DIAGNOSIS — M109 Gout, unspecified: Secondary | ICD-10-CM | POA: Insufficient documentation

## 2016-11-17 DIAGNOSIS — Z9889 Other specified postprocedural states: Secondary | ICD-10-CM | POA: Insufficient documentation

## 2016-11-17 DIAGNOSIS — M1990 Unspecified osteoarthritis, unspecified site: Secondary | ICD-10-CM | POA: Insufficient documentation

## 2016-11-17 DIAGNOSIS — Z9842 Cataract extraction status, left eye: Secondary | ICD-10-CM | POA: Diagnosis not present

## 2016-11-17 DIAGNOSIS — N8501 Benign endometrial hyperplasia: Secondary | ICD-10-CM | POA: Diagnosis present

## 2016-11-17 DIAGNOSIS — G629 Polyneuropathy, unspecified: Secondary | ICD-10-CM | POA: Diagnosis not present

## 2016-11-17 DIAGNOSIS — F419 Anxiety disorder, unspecified: Secondary | ICD-10-CM | POA: Diagnosis not present

## 2016-11-17 DIAGNOSIS — N8502 Endometrial intraepithelial neoplasia [EIN]: Secondary | ICD-10-CM | POA: Diagnosis not present

## 2016-11-17 NOTE — Progress Notes (Signed)
Consult Note: Gyn-Onc   Caroline Day 76 y.o. female  No chief complaint on file.   Assessment :Complex atypical hyperplasia of the endometrium status post D&C and endometrial ablation. Apparently responding to oral Provera.  Plan:  The patient will continue on Provera 10 mg a day. We will repeat an ultrasound to evaluate endometrial thickness in 3 months.  Interval history: Patient returns today along with her husband for further evaluation of endometrial hyperplasia with atypia. At our initial visit we elected to treat the patient with Provera 10 mg a day. She's been tolerating the Provera without difficulty. She denies any vaginal bleeding or any pelvic pain or pressure. She had a repeat ultrasound recently that shows the endometrial stripe to be 5.7 mm. (previously 9 mm)   HPI: 76 year old seen in consultation request of Dr. Arvella Nigh regarding management of complex endometrial hyperplasia with atypia. Patient had postmenopausal bleeding and apparently had a normal endometrial biopsy. She was noted to have thickened endometrial stripe and subsequently underwent a D&C and resection of endometrial polyp on 03/12/2016. At the same time she went underwent endometrial ablation. Final pathology returned showing an endometrial polyp with foci of complex atypical hyperplasia.  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  Given her medical comorbidities and high surgical risk we elected treat the patient with Provera 10 mg daily (chose to avoid using Megace given the patient's obesity)  Review of Systems:10 point review of systems is negative except as noted in interval history.   Vitals: Blood pressure (!) 139/57, pulse (!) 53, temperature 98 F (36.7 C), temperature source Oral, resp. rate 20, height 5' 8.5" (1.74 m), weight 290 lb  (131.5 kg), SpO2 97 %.  Physical Exam: General : The patient is a healthy woman in no acute distress. Morbidly obese. HEENT: normocephalic, extraoccular movements normal; neck is supple without thyromegally  Lynphnodes: Supraclavicular and inguinal nodes not enlarged  Abdomen: Soft, non-tender, no ascites, no organomegally, there is a large ventral hernia on the right which is easily reducible.  Lower extremities: No edema or varicosities. Normal range of motion      Allergies  Allergen Reactions  . Niacin And Related Rash  . Vancomycin Itching and Rash    Past Medical History:  Diagnosis Date  . Abrasion    left thigh from depends  . Anemia   . Anxiety   . Arthritis   . Depression   . Diabetes mellitus without complication ([place])   . Gout   . High cholesterol   . Hypertension   . Neuropathy   . Peripheral vascular disease [facility])     Past Surgical History:  Procedure Laterality Date  . AMPUTATION Right    toes  . CATARACT EXTRACTION, BILATERAL    . COLONOSCOPY    . DILATATION & CURETTAGE/HYSTEROSCOPY WITH MYOSURE N/A 03/12/2016   Procedure: DILATATION & CURETTAGE/HYSTEROSCOPY WITH MYOSURE;  Surgeon: Arvella Nigh, MD;  Location: [place] [facility];  Service: Gynecology;  Laterality: N/A;  . FOOT SURGERY Right    ulcer  . REVERSE SHOULDER ARTHROPLASTY Right   . TOTAL KNEE ARTHROPLASTY     right side times 2, left side  . TOTAL SHOULDER ARTHROPLASTY Left   . UPPER GI ENDOSCOPY      Current Outpatient Prescriptions  Medication Sig Dispense Refill  . acetaminophen (TYLENOL) 500 MG tablet Take 500-1,000 mg by mouth every 6 (six) hours as needed (for pain.).    Marland Kitchen  allopurinol (ZYLOPRIM) 300 MG tablet Take 300 mg by mouth daily.    . clotrimazole-betamethasone (LOTRISONE) cream Apply 1 application topically 2 (two) times daily as needed (for sore spot).    . ferrous sulfate 325 (65 FE) MG tablet Take 325 mg by mouth daily with breakfast.    . furosemide (LASIX) 20 MG tablet Take 20  mg by mouth daily.    Marland Kitchen gabapentin (NEURONTIN) 600 MG tablet Take 600-1,200 mg by mouth 2 (two) times daily. 600 mg in the morning & 1200 mg in the evening    . HUMALOG MIX 75/25 KWIKPEN (75-25) 100 UNIT/ML Kwikpen Inject 35-45 Units into the skin 2 (two) times daily. 45 units in the morning and 35 units in the evening  5  . imipramine (TOFRANIL) 25 MG tablet Take 25 mg by mouth at bedtime.    Marland Kitchen lisinopril (PRINIVIL,ZESTRIL) 10 MG tablet Take 10 mg by mouth daily.    . metoprolol succinate (TOPROL-XL) 50 MG 24 hr tablet Take 50 mg by mouth daily. Take with or immediately following a meal.    . polyvinyl alcohol (LIQUIFILM TEARS) 1.4 % ophthalmic solution Place 1 drop into both eyes 3 (three) times daily as needed for dry eyes.    Marland Kitchen sertraline (ZOLOFT) 50 MG tablet Take 50 mg by mouth at bedtime.    . Vitamin D, Ergocalciferol, (DRISDOL) 50000 units CAPS capsule Take 50,000 Units by mouth every Saturday.     No current facility-administered medications for this visit.     Social History   Social History  . Marital status: Married    Spouse name: N/A  . Number of children: N/A  . Years of education: N/A   Occupational History  . Not on file.   Social History Main Topics  . Smoking status: Never Smoker  . Smokeless tobacco: Never Used  . Alcohol use Yes     Comment: rare  . Drug use: No  . Sexual activity: Not on file   Other Topics Concern  . Not on file   Social History Narrative  . No narrative on file    No family history on file.    Marti Sleigh, MD 11/17/2016, 1:52 PM

## 2016-11-17 NOTE — Patient Instructions (Signed)
Plan on having an [place] in three months with an appointment to see Dr. Fermin Schwab after.  Arrive to your ultrasound with a full bladder.  Please call [phone] in late November or early December to schedule your appointment to see Dr. Fermin Schwab after your ultrasound.

## 2017-02-13 ENCOUNTER — Ambulatory Visit (HOSPITAL_COMMUNITY)
Admission: RE | Admit: 2017-02-13 | Discharge: 2017-02-13 | Disposition: A | Discharge status: Home / Self Care | Payer: Medicare Other | Source: Ambulatory Visit | Attending: Gynecologic Oncology | Admitting: Gynecologic Oncology

## 2017-02-13 DIAGNOSIS — D259 Leiomyoma of uterus, unspecified: Secondary | ICD-10-CM | POA: Insufficient documentation

## 2017-02-13 DIAGNOSIS — N8502 Endometrial intraepithelial neoplasia [EIN]: Secondary | ICD-10-CM

## 2017-02-13 DIAGNOSIS — R9389 Abnormal findings on diagnostic imaging of other specified body structures: Secondary | ICD-10-CM | POA: Insufficient documentation

## 2017-03-20 ENCOUNTER — Inpatient Hospital Stay: Payer: Medicare Other | Attending: Gynecology | Admitting: Gynecology

## 2017-03-20 ENCOUNTER — Encounter: Payer: Self-pay | Admitting: Gynecology

## 2017-03-20 VITALS — BP 147/54 | HR 54 | Temp 98.1°F | Resp 20

## 2017-03-20 DIAGNOSIS — E11621 Type 2 diabetes mellitus with foot ulcer: Secondary | ICD-10-CM | POA: Diagnosis not present

## 2017-03-20 DIAGNOSIS — I10 Essential (primary) hypertension: Secondary | ICD-10-CM | POA: Diagnosis not present

## 2017-03-20 DIAGNOSIS — Z794 Long term (current) use of insulin: Secondary | ICD-10-CM

## 2017-03-20 DIAGNOSIS — M1990 Unspecified osteoarthritis, unspecified site: Secondary | ICD-10-CM

## 2017-03-20 DIAGNOSIS — E1151 Type 2 diabetes mellitus with diabetic peripheral angiopathy without gangrene: Secondary | ICD-10-CM | POA: Diagnosis not present

## 2017-03-20 DIAGNOSIS — Z79899 Other long term (current) drug therapy: Secondary | ICD-10-CM

## 2017-03-20 DIAGNOSIS — N8502 Endometrial intraepithelial neoplasia [EIN]: Secondary | ICD-10-CM

## 2017-03-20 NOTE — Progress Notes (Signed)
Consult Note: Gyn-Onc   Caroline Day 77 y.o. female  Chief Complaint  Patient presents with  . Complex atypical endometrial hyperplasia    Assessment :Complex atypical hyperplasia of the endometrium status post D&C and endometrial ablation. Responding to oral Provera.  Plan:  The patient will continue on Provera 10 mg a day.  Given the continued shrinking of her endometrial stripe which is documented on repeated ultrasounds, we will repeat an ultrasound to evaluate endometrial thickness in 6 months.  Interval history: Patient returns today along with her husband for further evaluation of endometrial hyperplasia with atypia. At our initial visit we elected to treat the patient with Provera 10 mg a day. She's been tolerating the Provera without difficulty. She denies any vaginal bleeding or any pelvic pain or pressure. She had a repeat ultrasound recently that shows the endometrial stripe to be 7 mm. (previously 9 mm)   HPI: 77 year old seen in consultation request of Dr. Arvella Nigh regarding management of complex endometrial hyperplasia with atypia. Patient had postmenopausal bleeding and apparently had a normal endometrial biopsy. She was noted to have thickened endometrial stripe and subsequently underwent a D&C and resection of endometrial polyp on 03/12/2016. At the same time she went underwent endometrial ablation. Final pathology returned showing an endometrial polyp with foci of complex atypical hyperplasia.  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  Given her medical comorbidities and high surgical risk we elected treat the patient with Provera 10 mg daily (chose to avoid using Megace given the patient's obesity)  Review of Systems:10 point review of systems is negative except as noted in interval history.   Vitals: Blood  pressure (!) 147/54, pulse (!) 54, temperature 98.1 F (36.7 C), temperature source Oral, resp. rate 20, SpO2 95 %.  Physical Exam: General : The patient is a healthy woman in no acute distress. Morbidly obese. HEENT: normocephalic, extraoccular movements normal; neck is supple without thyromegally  Lynphnodes: Supraclavicular and inguinal nodes not enlarged  Abdomen: Soft, non-tender, no ascites, no organomegally, there is a large ventral hernia on the right which is easily reducible.  Lower extremities: No edema or varicosities. Normal range of motion      Allergies  Allergen Reactions  . Niacin And Related Rash  . Vancomycin Itching and Rash    Past Medical History:  Diagnosis Date  . Abrasion    left thigh from depends  . Anemia   . Anxiety   . Arthritis   . Depression   . Diabetes mellitus without complication ([place])   . Gout   . High cholesterol   . Hypertension   . Neuropathy   . Peripheral vascular disease [facility])     Past Surgical History:  Procedure Laterality Date  . AMPUTATION Right    toes  . CATARACT EXTRACTION, BILATERAL    . COLONOSCOPY    . DILATATION & CURETTAGE/HYSTEROSCOPY WITH MYOSURE N/A 03/12/2016   Procedure: DILATATION & CURETTAGE/HYSTEROSCOPY WITH MYOSURE;  Surgeon: Arvella Nigh, MD;  Location: [place] [facility];  Service: Gynecology;  Laterality: N/A;  . FOOT SURGERY Right    ulcer  . REVERSE SHOULDER ARTHROPLASTY Right   . TOTAL KNEE ARTHROPLASTY     right side times 2, left side  . TOTAL SHOULDER ARTHROPLASTY Left   . UPPER GI ENDOSCOPY      Current Outpatient Medications  Medication Sig Dispense Refill  . acetaminophen (TYLENOL) 500 MG tablet Take  500-1,000 mg by mouth every 6 (six) hours as needed (for pain.).    Marland Kitchen allopurinol (ZYLOPRIM) 300 MG tablet Take 300 mg by mouth daily.    Marland Kitchen atorvastatin (LIPITOR) 40 MG tablet     . clotrimazole-betamethasone (LOTRISONE) cream Apply 1 application topically 2 (two) times daily as needed (for sore spot).     . diclofenac sodium (VOLTAREN) 1 % GEL Apply topically.    . ferrous sulfate 325 (65 FE) MG tablet Take 325 mg by mouth daily with breakfast.    . furosemide (LASIX) 20 MG tablet Take 20 mg by mouth daily.    Marland Kitchen gabapentin (NEURONTIN) 600 MG tablet Take 600-1,200 mg by mouth 2 (two) times daily. 600 mg in the morning & 1200 mg in the evening    . HUMALOG MIX 75/25 KWIKPEN (75-25) 100 UNIT/ML Kwikpen Inject 35-45 Units into the skin 2 (two) times daily. 45 units in the morning and 35 units in the evening  5  . HYDROcodone-acetaminophen (NORCO/VICODIN) 5-325 MG tablet Take by mouth.    Marland Kitchen lisinopril (PRINIVIL,ZESTRIL) 10 MG tablet Take 10 mg by mouth daily.    . medroxyPROGESTERone (PROVERA) 10 MG tablet Take by mouth.    . metoprolol succinate (TOPROL-XL) 50 MG 24 hr tablet Take 50 mg by mouth daily. Take with or immediately following a meal.    . omeprazole (PRILOSEC) 20 MG capsule Take 20 mg by mouth 2 (two) times daily before a meal.    . polyvinyl alcohol (LIQUIFILM TEARS) 1.4 % ophthalmic solution Place 1 drop into both eyes 3 (three) times daily as needed for dry eyes.    Marland Kitchen triamcinolone cream (KENALOG) 0.1 % Apply topically.    . Vitamin D, Ergocalciferol, (DRISDOL) 50000 units CAPS capsule TAKE 1 CAPSULE EVERY WEDNESDAY AND 1 CAPSULE EVERY SATURDAY FOR VITAMIND DEFICIENCY     No current facility-administered medications for this visit.     Social History   Socioeconomic History  . Marital status: Married    Spouse name: Not on file  . Number of children: Not on file  . Years of education: Not on file  . Highest education level: Not on file  Social Needs  . Financial resource strain: Not on file  . Food insecurity - worry: Not on file  . Food insecurity - inability: Not on file  . Transportation needs - medical: Not on file  . Transportation needs - non-medical: Not on file  Occupational History  . Not on file  Tobacco Use  . Smoking status: Never Smoker  . Smokeless  tobacco: Never Used  Substance and Sexual Activity  . Alcohol use: Yes    Comment: rare  . Drug use: No  . Sexual activity: Not on file  Other Topics Concern  . Not on file  Social History Narrative  . Not on file    History reviewed. No pertinent family history.    Marti Sleigh, MD 03/20/2017, 2:58 PM

## 2017-03-20 NOTE — Patient Instructions (Addendum)
Plan on having an ultrasound in six months with an appointment after- we have scheduled your ultrasound at [facility] on August 8th 2019 at 1:30 pm, Arrive at 1:15 pm.   In July, call our office to schedule a follow up appointment for  after the August 8th's ultrasound. Please call for any questions, concerns, or new symptoms.

## 2017-06-10 DEATH — deceased

## 2017-09-08 ENCOUNTER — Telehealth: Payer: Self-pay | Admitting: *Deleted

## 2017-09-08 NOTE — Telephone Encounter (Signed)
Attempted to contact the patient at the mobile number. Call was unable to go thru, couple attempted made. Several attempts made to reach her at the home number, it was busy.

## 2017-09-10 ENCOUNTER — Telehealth: Payer: Self-pay | Admitting: *Deleted

## 2017-09-10 NOTE — Telephone Encounter (Signed)
Patient's husband returned call and stated that "Dezarae died 27-Jun-2022."

## 2017-09-17 ENCOUNTER — Ambulatory Visit (HOSPITAL_COMMUNITY): Payer: Medicare Other

## 2018-01-12 IMAGING — US US TRANSVAGINAL NON-OB
1 series · 13 of 25 positions shown · non-contrast
Comparison: CT abdomen and pelvis 02/02/2014

CLINICAL DATA: Atypical endometrial hyperplasia

EXAM:
ULTRASOUND PELVIS TRANSVAGINAL
TECHNIQUE: Transvaginal ultrasound examination of the pelvis was performed
including evaluation of the uterus, ovaries, adnexal regions, and
pelvic cul-de-sac. Transabdominal imaging was not ordered.

[Series 1: us transvaginal non-ob · 0.14mm/px · 13 of 51 slices shown]
[im 1/51]
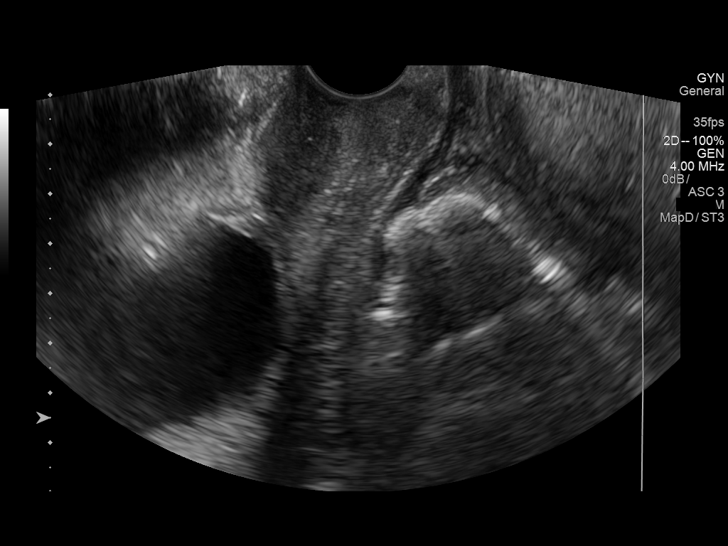
[im 5/51]
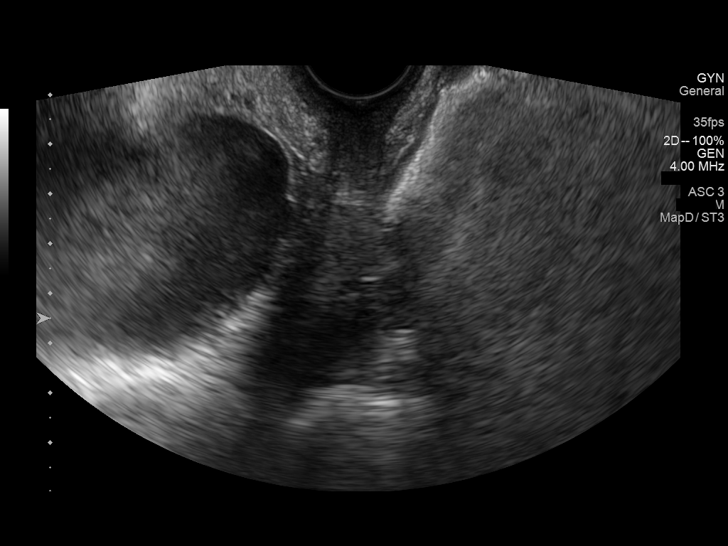
[im 9/51]
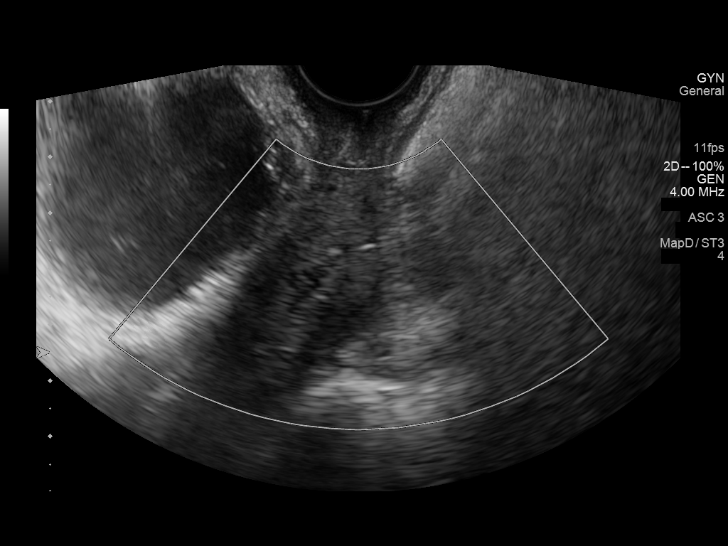
[im 13/51]
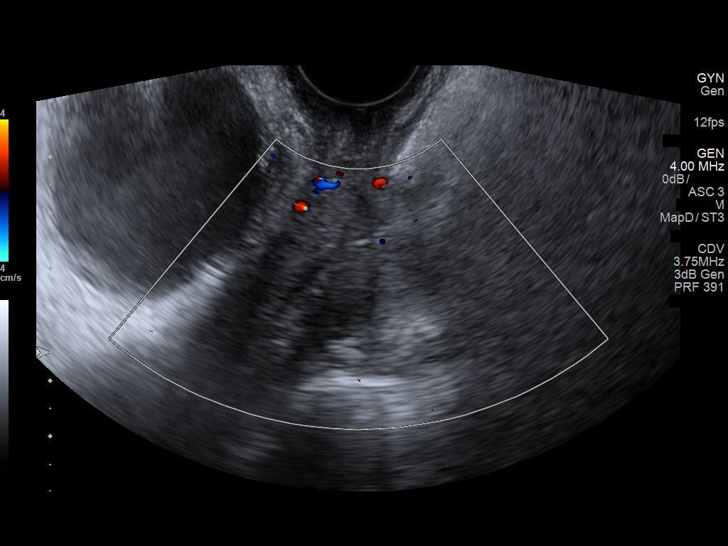
[im 17/51]
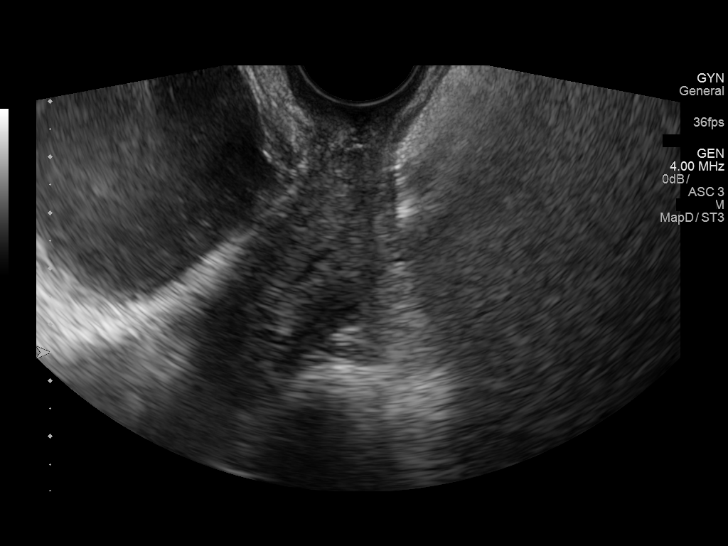
[im 21/51]
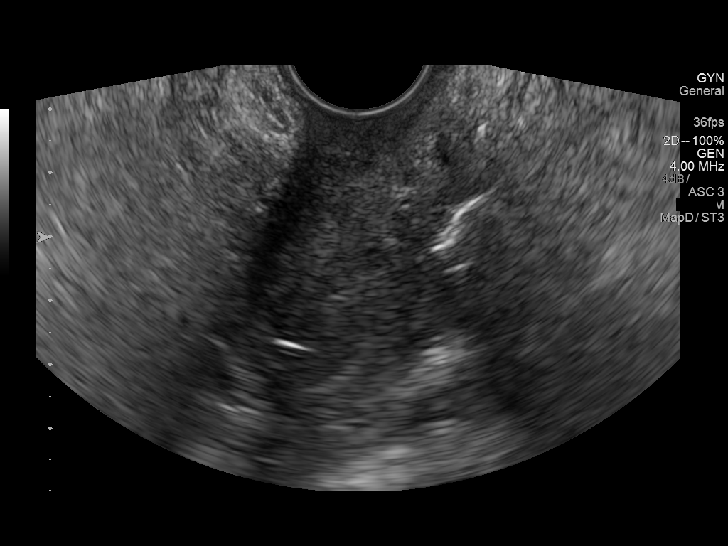
[im 26/51]
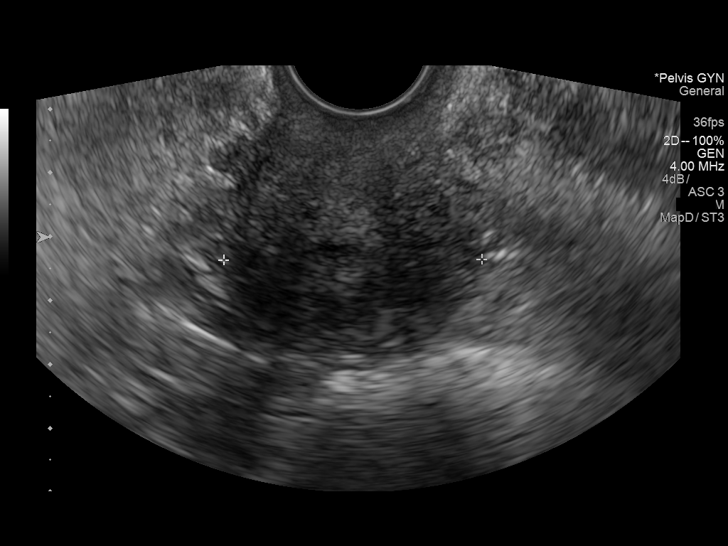
[im 30/51]
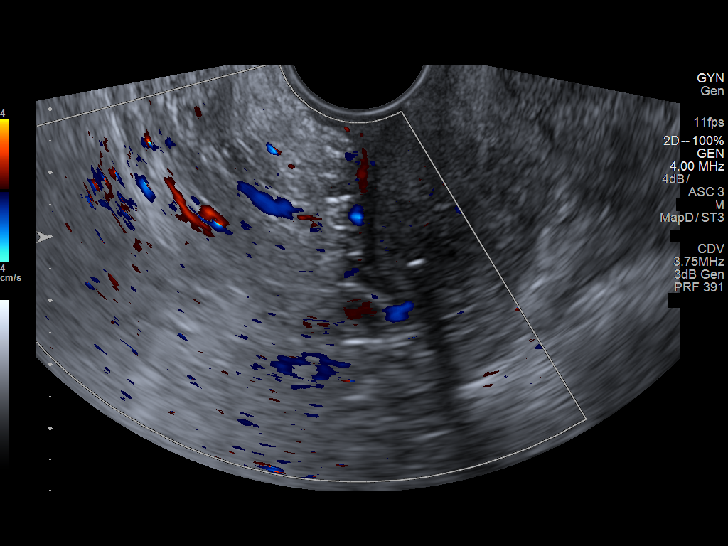
[im 34/51]
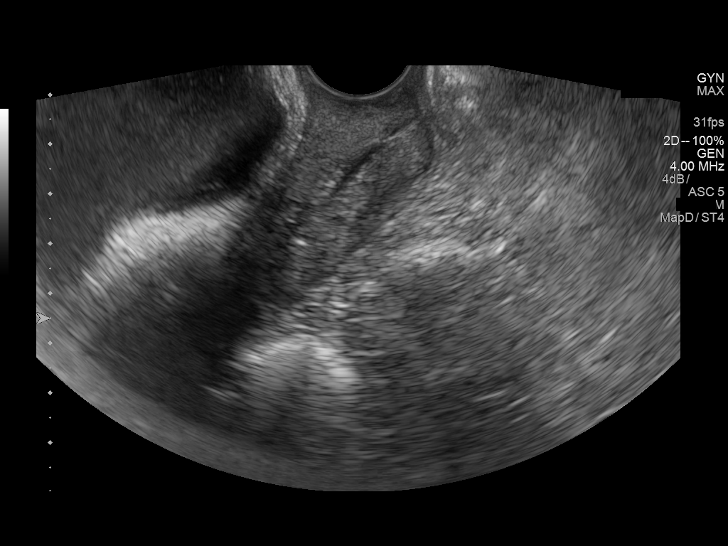
[im 38/51]
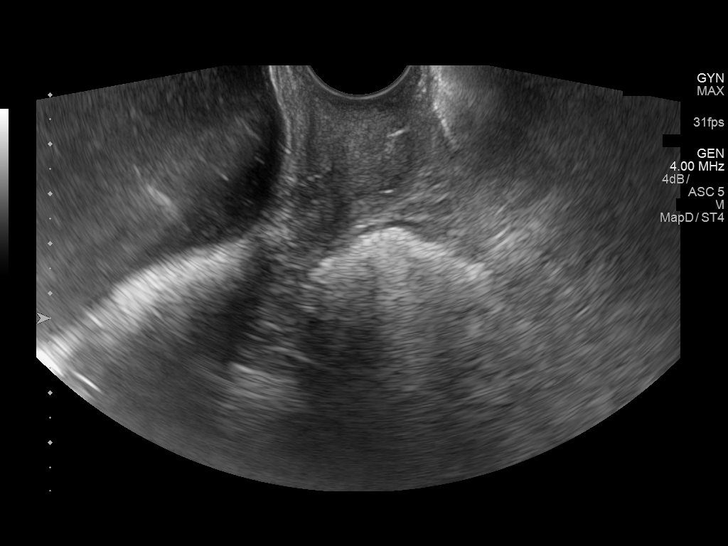
[im 42/51]
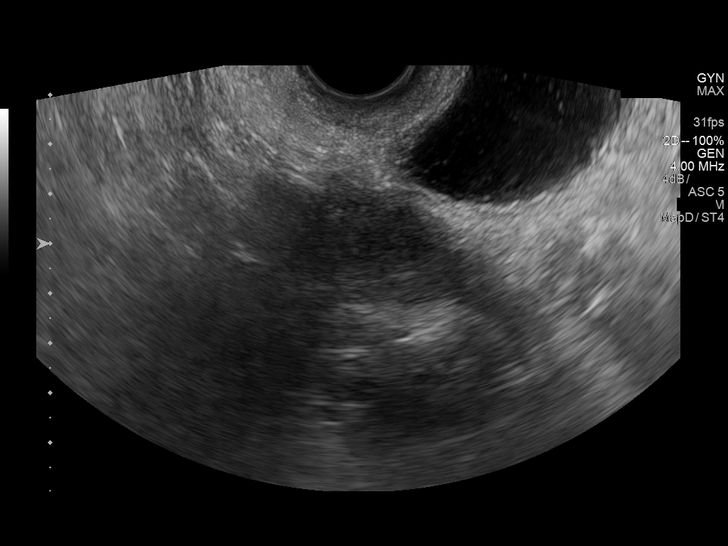
[im 46/51]
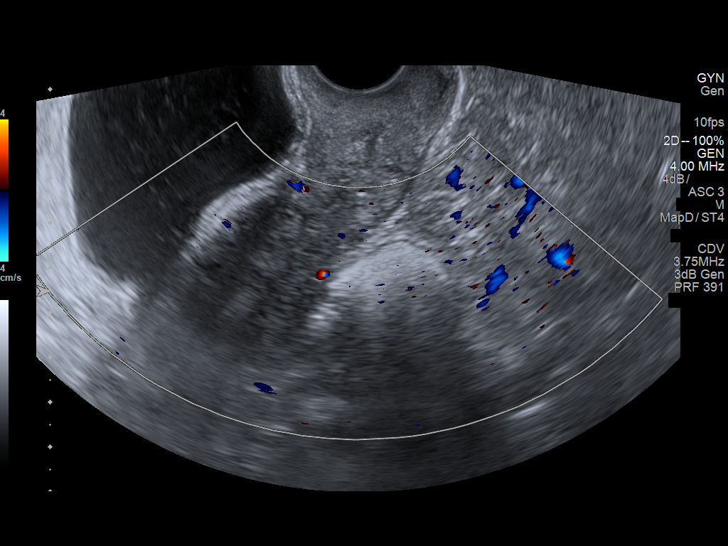
[im 51/51]
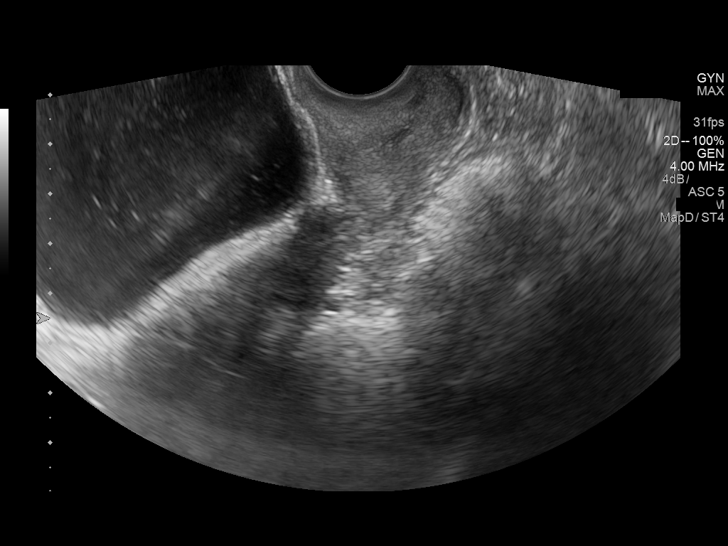

[13 of 25 positions shown; findings below may reference images not displayed]

FINDINGS: Uterus

Measurements: 8.2 x 3.1 x 4.0 cm. Normal morphology without mass

Endometrium

Thickness: 9 mm thick, abnormal for postmenopausal patient.. Trace
endometrial fluid

Right ovary

Not visualized on transvaginal imaging question obscured by bowel
loops

Left ovary

Not identified on transvaginal imaging question obscured by bowel
loops

Other findings:  No free pelvic fluid or adnexal masses
IMPRESSION: Abnormal thickened endometrial complex for a postmenopausal patient
with a small amount of associated endometrial fluid.

In the setting of post-menopausal bleeding, endometrial sampling is
indicated to exclude carcinoma. If results are benign,
sonohysterogram should be considered for focal lesion work-up. (Ref:
Radiological Reasoning: Algorithmic Workup of Abnormal Vaginal
Bleeding with Endovaginal Sonography and Sonohysterography. AJR
[id number]; 191:S68-73)

## 2019-07-22 IMAGING — US US TRANSVAGINAL NON-OB
1 series · 13 of 25 positions shown · non-contrast
Comparison: Prior ultrasound from 06/17/2016.

CLINICAL DATA: Initial evaluation for postmenopausal bleeding.
History of atypical endometrial hyperplasia.

EXAM:
TRANSVAGINAL ULTRASOUND OF PELVIS
TECHNIQUE: Transvaginal ultrasound examinations of the pelvis was performed
including evaluation of the uterus, ovaries, adnexal regions, and
pelvic cul-de-sac.

[Series 1: us transvaginal non-ob · 0.11mm/px · 43 acquisitions, 13 frames shown]
[im 1/43]
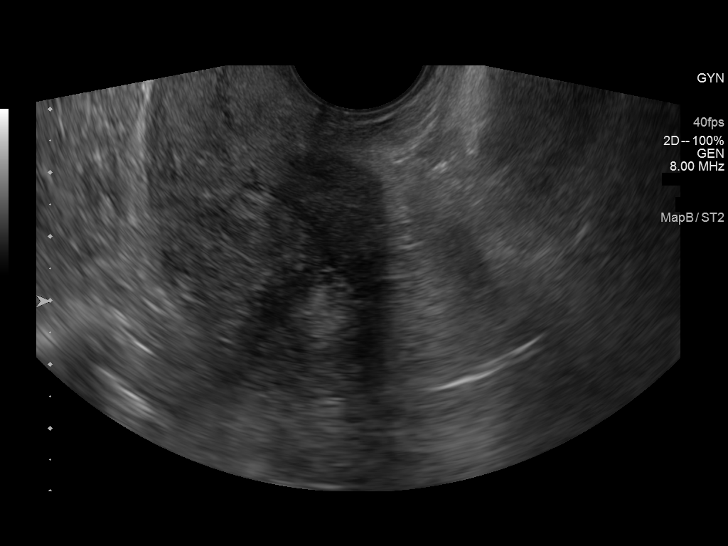
[im 4/43]
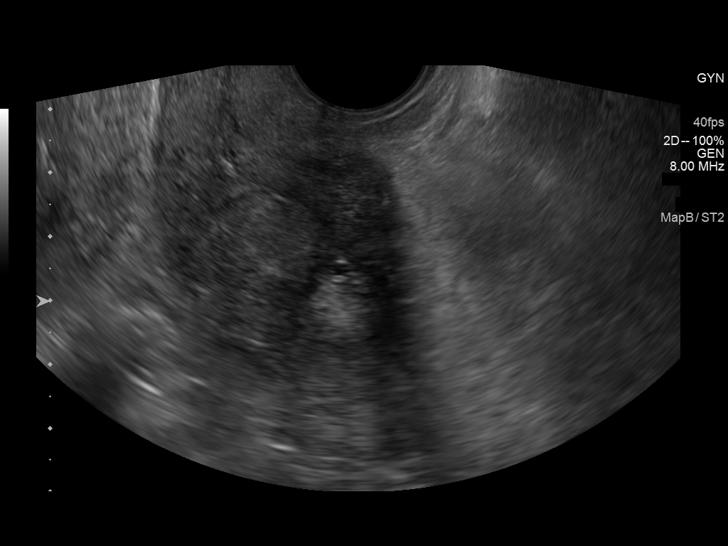
[im 8/43]
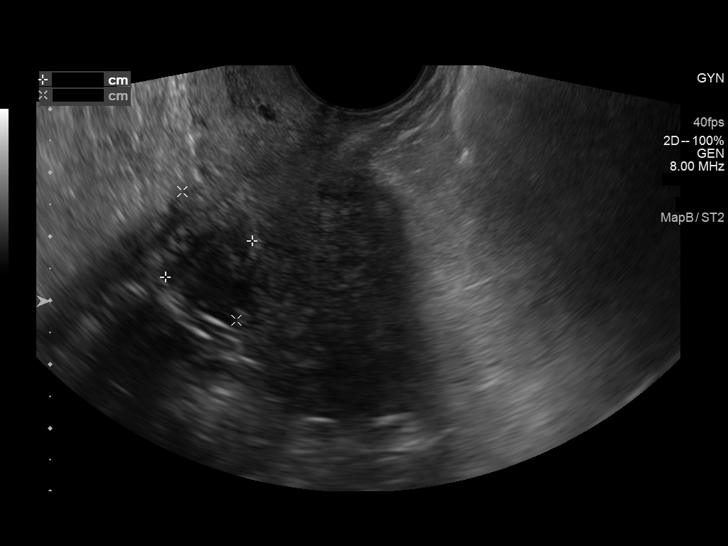
[im 11/43]
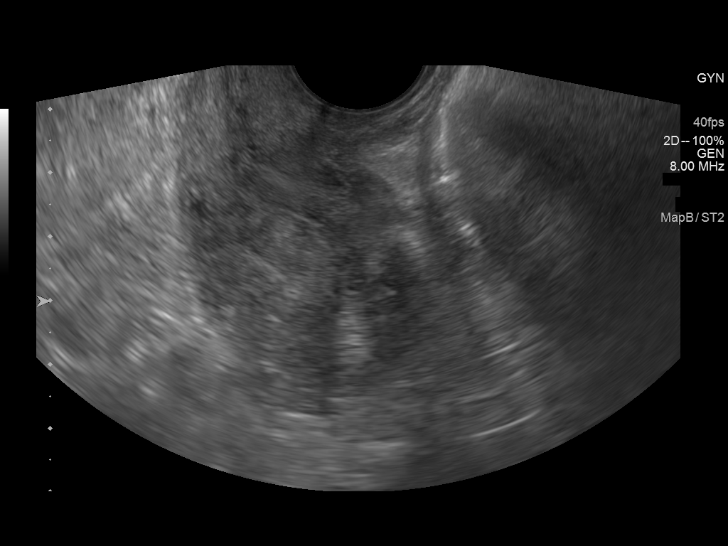
[im 15/43]
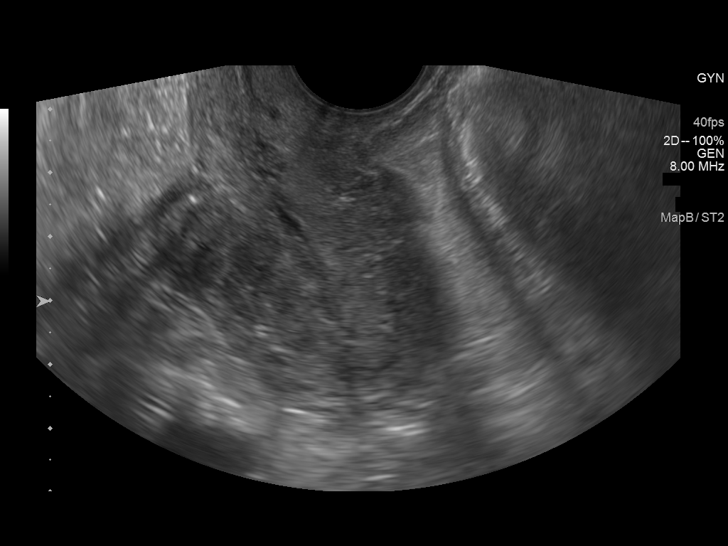
[im 18/43]
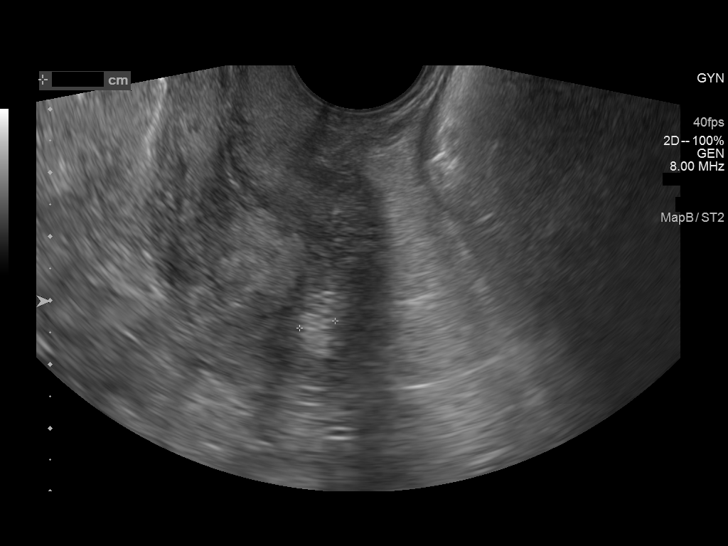
[im 22/43]
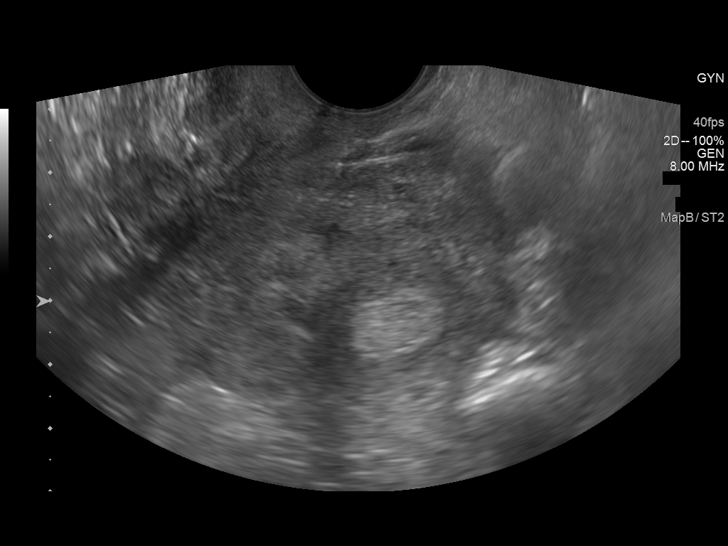
[im 25/43]
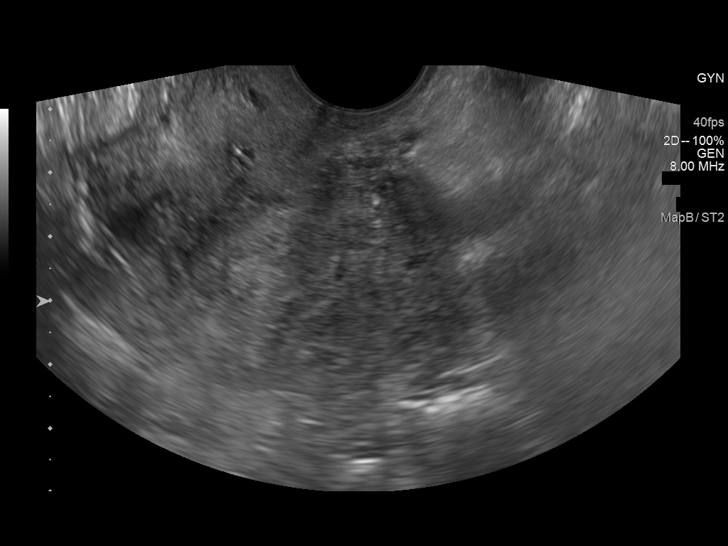
[im 29/43]
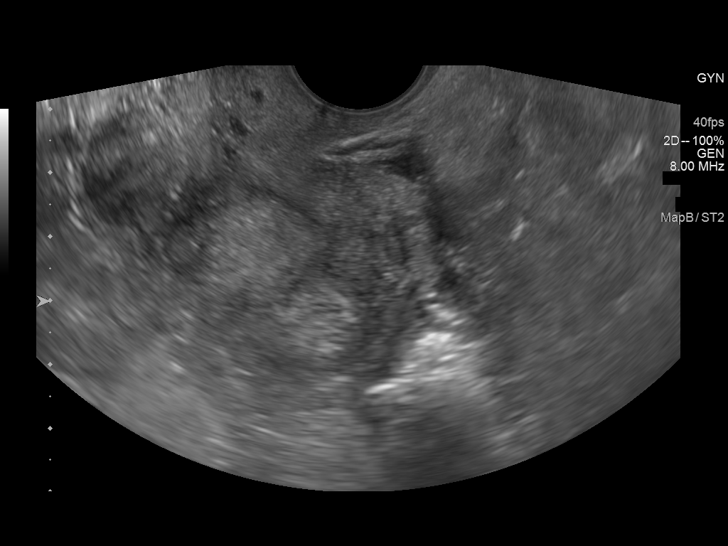
[im 32/43]
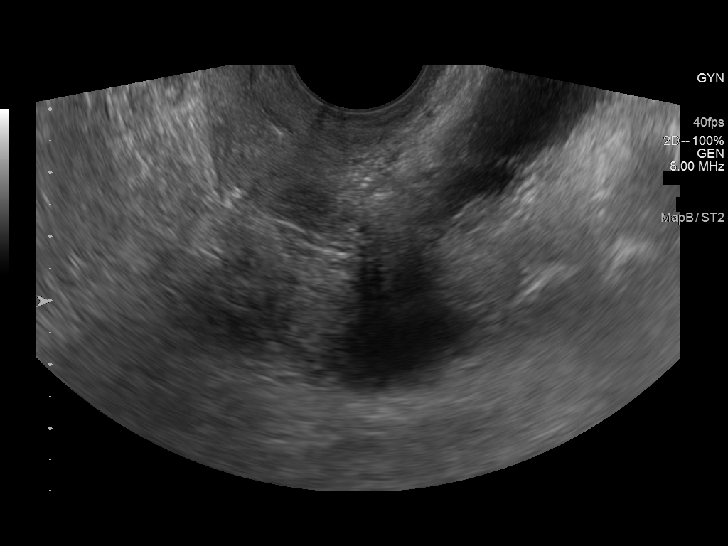
[im 36/43]
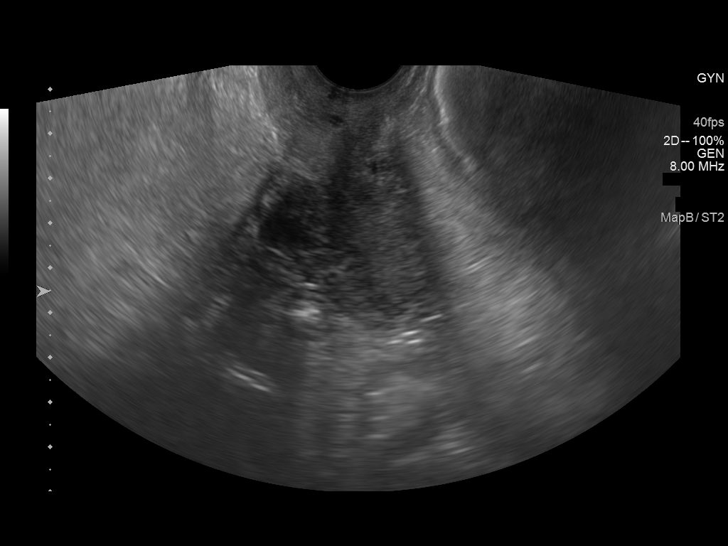
[im 39/43]
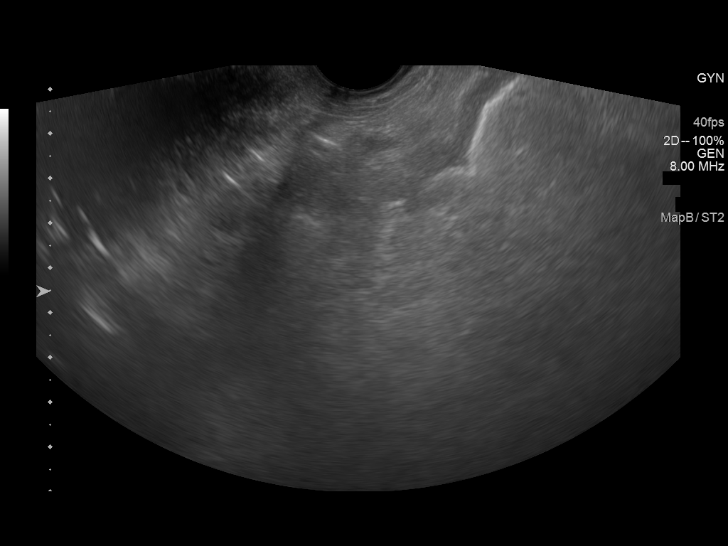
[im 43/43]
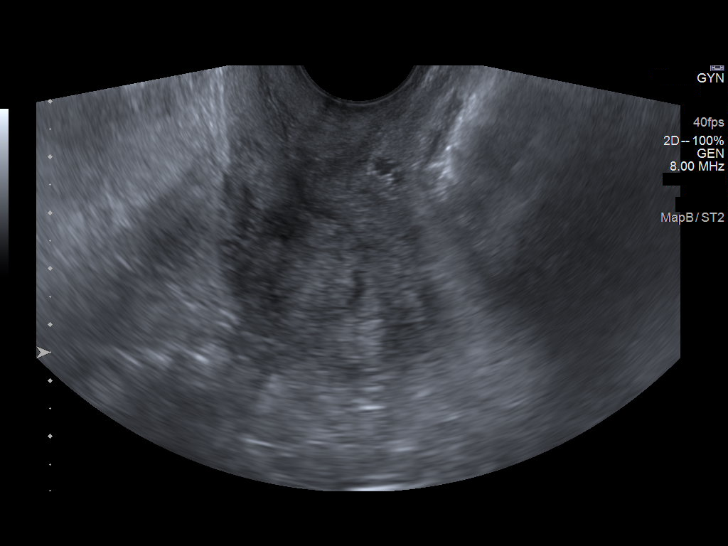

[13 of 25 positions shown; findings below may reference images not displayed]

FINDINGS: Uterus

Measurements: 6.0 x 3.8 x 5.3 cm. Two small uterine fibroids evident
on today's study. One is submucosal and location at the posterior
uterine body measured 1.8 x 1.9 x 2.2 cm. Second is intramural and
positioned at the posterior uterine body near the lower uterine
segment and measured 1.5 x 2.2 x 1.8 cm

Endometrium

Thickness: 5.7 mm, considered abnormally thick given history of
postmenopausal bleeding. Endometrial stripe previously measured 9 mm
on 06/17/2016.

Right ovary

Not visualized.  No adnexal mass.

Left ovary

Not visualized.  No adnexal mass.

Other findings

No abnormal free fluid.
IMPRESSION: 1. Persistent abnormally thickened endometrial stripe given history
of postmenopausal bleeding, measuring 5.7 mm on today's exam
(previously 9 mm on 06/17/2016. Again, in the setting of
post-menopausal bleeding, endometrial sampling is indicated to
exclude carcinoma. If results are benign, sonohysterogram should be
considered for focal lesion work-up. (Ref: Radiological Reasoning:
Algorithmic Workup of Abnormal Vaginal Bleeding with Endovaginal
Sonography and Sonohysterography. AJR [id number]; 191:S68-73).
2. Fibroid uterus as above.

## 2019-10-21 IMAGING — US US TRANSVAGINAL NON-OB
1 series · 13 of 25 positions shown · non-contrast
Comparison: Ultrasound 11/14/2016.

CLINICAL DATA: Endometrial hyperplasia. History of postmenopausal
bleeding.

EXAM:
TRANSABDOMINAL AND TRANSVAGINAL ULTRASOUND OF PELVIS
TECHNIQUE: Both transabdominal and transvaginal ultrasound examinations of the
pelvis were performed. Transabdominal technique was performed for
global imaging of the pelvis including uterus, ovaries, adnexal
regions, and pelvic cul-de-sac. It was necessary to proceed with
endovaginal exam following the transabdominal exam to visualize the
uterus and ovaries.

[Series 1: us transvaginal non-ob · 0.22mm/px · 13 of 49 slices shown]
[im 1/49]
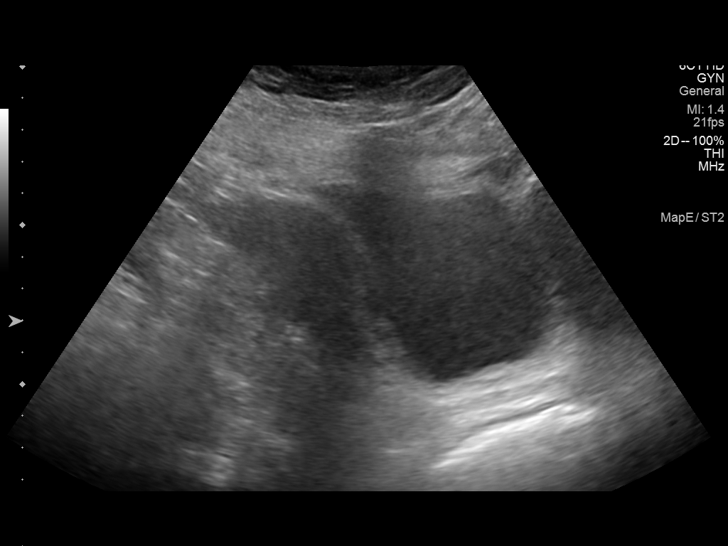
[im 5/49]
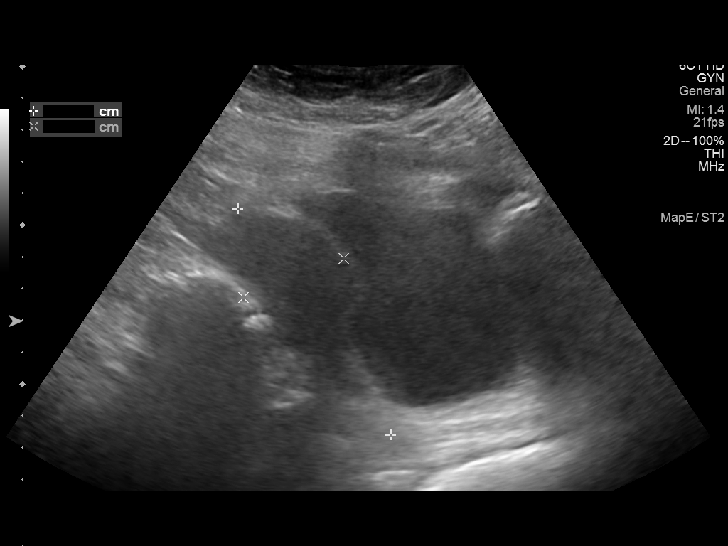
[im 9/49]
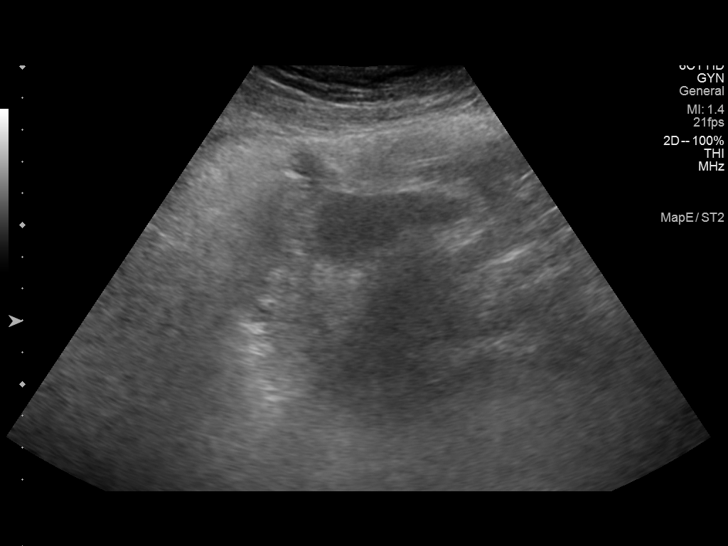
[im 13/49]
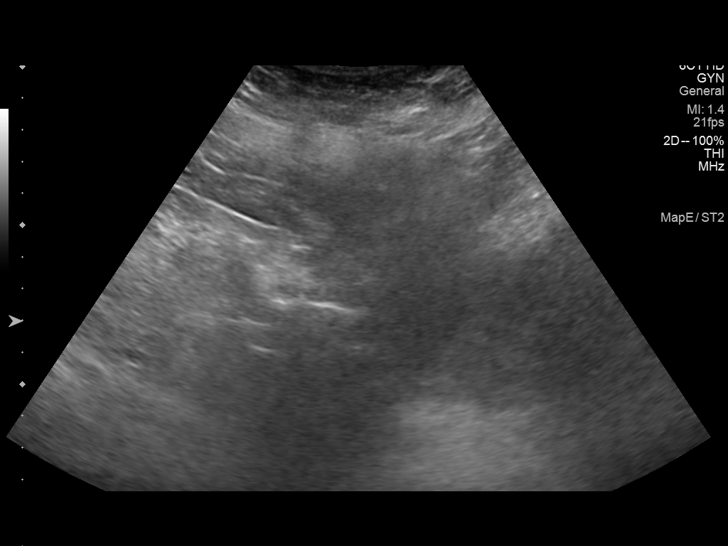
[im 17/49]
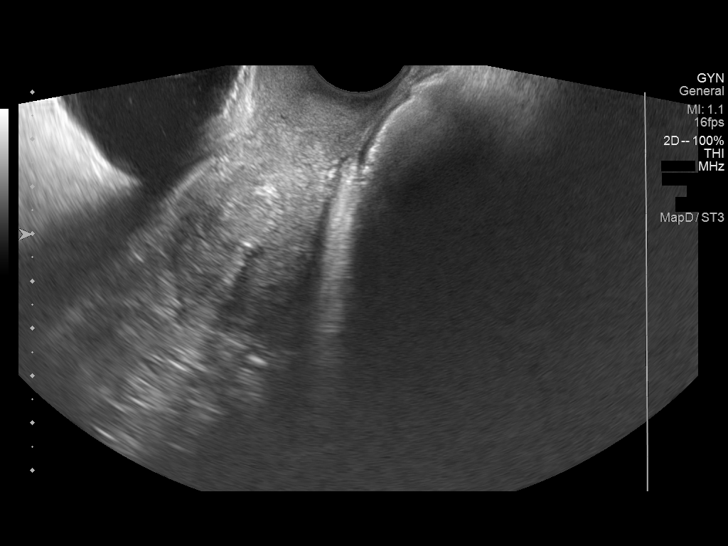
[im 21/49]
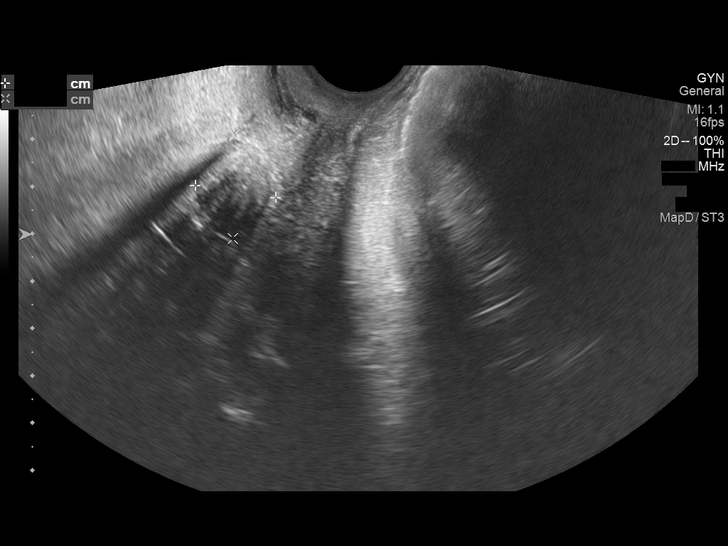
[im 25/49]
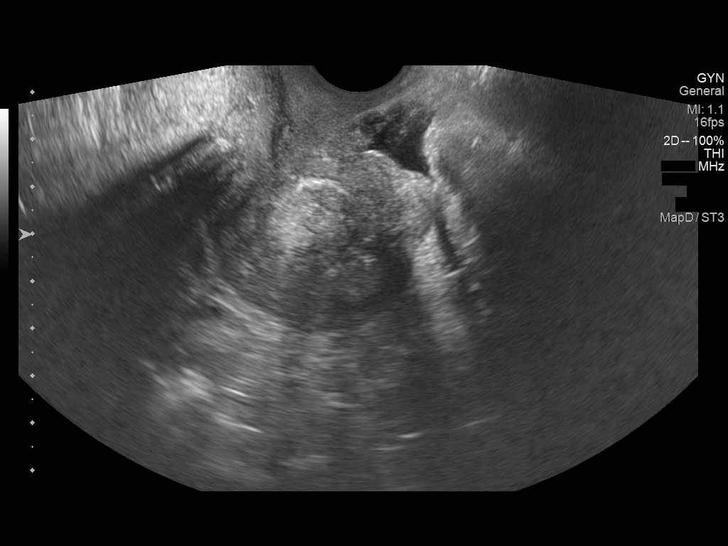
[im 29/49]
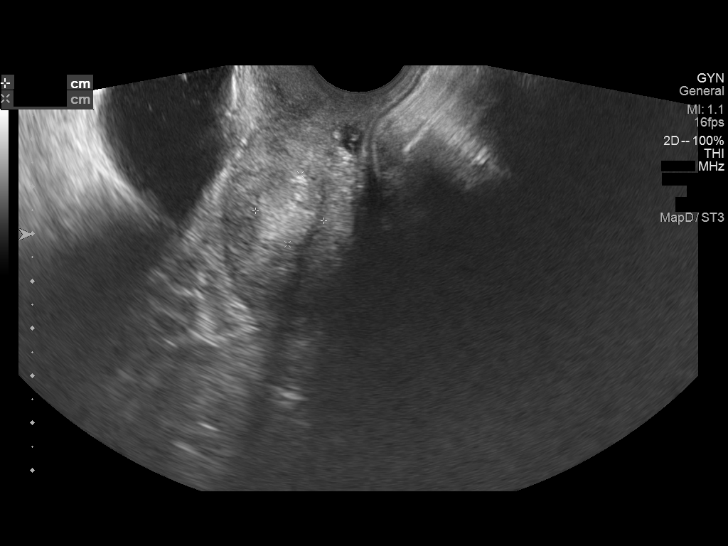
[im 33/49]
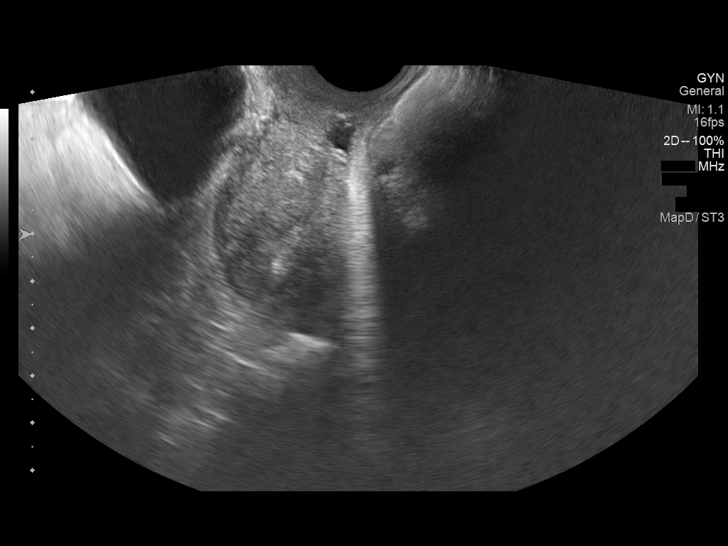
[im 37/49]
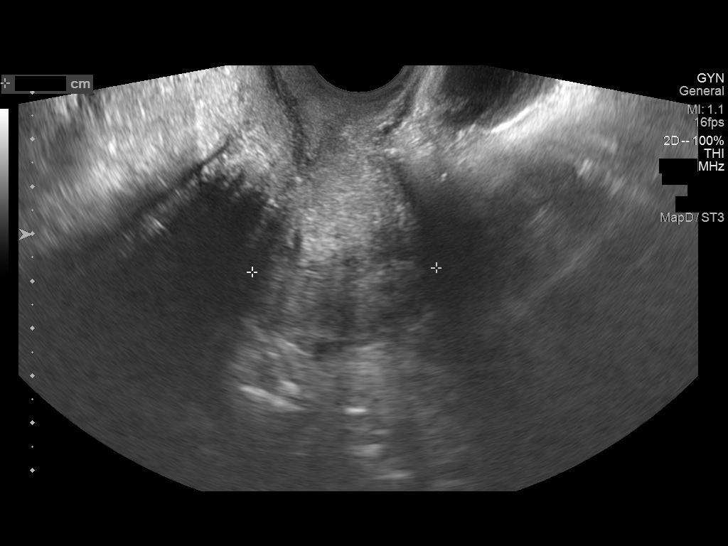
[im 41/49]
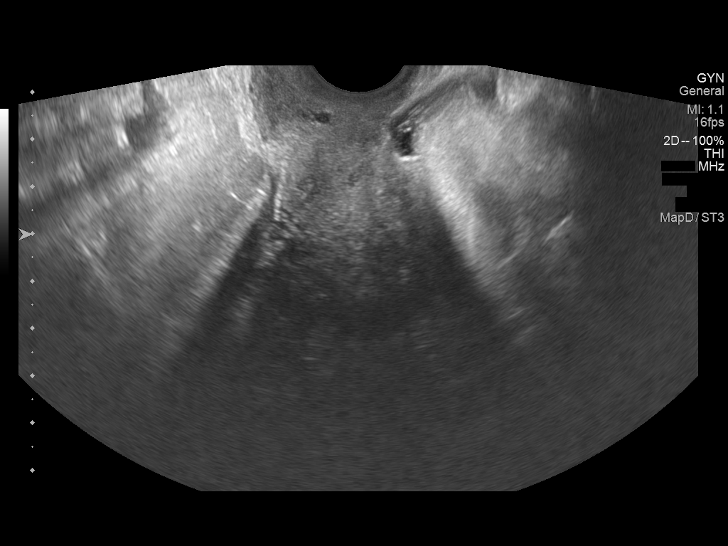
[im 45/49]
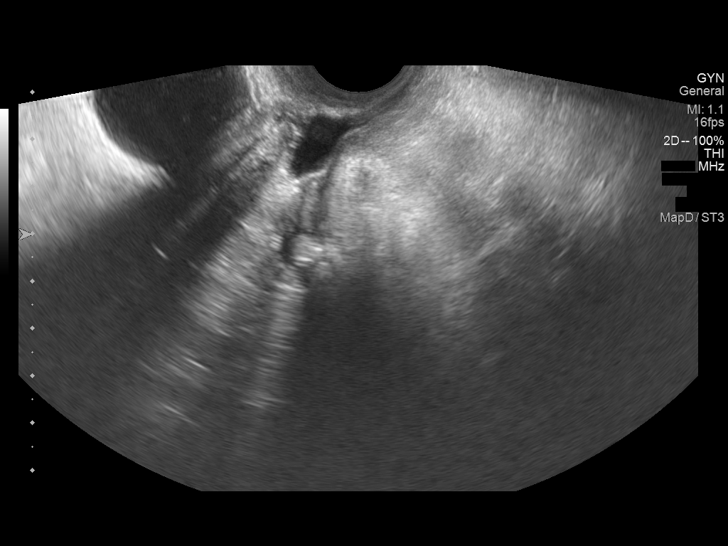
[im 49/49]
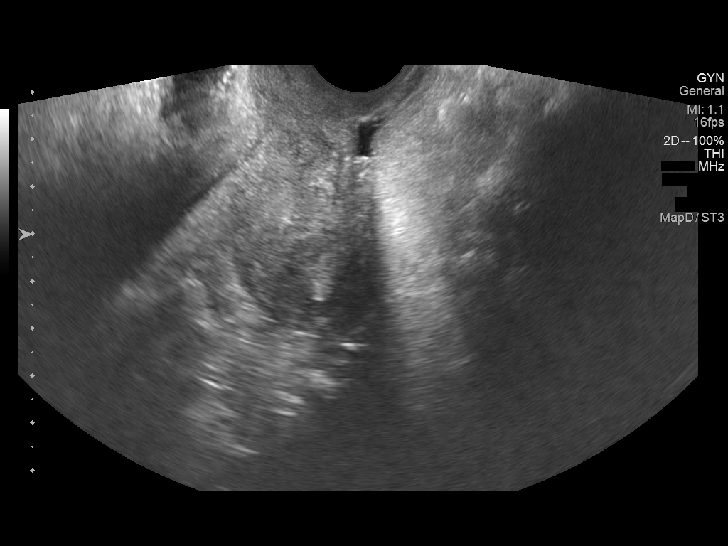

[13 of 25 positions shown; findings below may reference images not displayed]

FINDINGS: Uterus

Measurements: 6.1 x 3.3 x 3.9 cm. A 2.2 and 2.1 cm uterine fibroid
again noted. Similar findings noted on prior exam.

Endometrium

Thickness: 7.  No focal abnormality visualized.

Right ovary

Not visualized.

Left ovary

Not visualized.

Other findings

No abnormal free fluid.
IMPRESSION: 1. Uterine fibroids again noted.  Similar findings on prior exam.

2. Endometrial thickening again noted. Endometrial thickening to 7
mm noted on today's exam. Again in the setting of post-menopausal
bleeding, endometrial sampling is indicated to exclude carcinoma. If
results are benign, sonohysterogram should be considered for focal
lesion work-up. (Ref: Radiological Reasoning: Algorithmic Workup of
Abnormal Vaginal Bleeding with Endovaginal Sonography and
Sonohysterography. AJR [id number]; 191:S68-73)
